# Patient Record
Sex: Female | Born: 1985 | Race: Black or African American | Hispanic: No | Marital: Single | Smoking: Never smoker
Health system: Southern US, Community
[De-identification: ages and names within clinical notes are randomized; demographics above are authoritative.]

## PROBLEM LIST (undated history)

## (undated) DIAGNOSIS — K5792 Diverticulitis of intestine, part unspecified, without perforation or abscess without bleeding: Secondary | ICD-10-CM

## (undated) DIAGNOSIS — F419 Anxiety disorder, unspecified: Secondary | ICD-10-CM

## (undated) DIAGNOSIS — D649 Anemia, unspecified: Secondary | ICD-10-CM

## (undated) DIAGNOSIS — IMO0002 Reserved for concepts with insufficient information to code with codable children: Secondary | ICD-10-CM

## (undated) DIAGNOSIS — I10 Essential (primary) hypertension: Secondary | ICD-10-CM

## (undated) DIAGNOSIS — T7840XA Allergy, unspecified, initial encounter: Secondary | ICD-10-CM

## (undated) DIAGNOSIS — F329 Major depressive disorder, single episode, unspecified: Secondary | ICD-10-CM

## (undated) DIAGNOSIS — F32A Depression, unspecified: Secondary | ICD-10-CM

## (undated) HISTORY — DX: Depression, unspecified: F32.A

## (undated) HISTORY — PX: TUBAL LIGATION: SHX77

## (undated) HISTORY — PX: NO PAST SURGERIES: SHX2092

## (undated) HISTORY — DX: Reserved for concepts with insufficient information to code with codable children: IMO0002

## (undated) HISTORY — DX: Major depressive disorder, single episode, unspecified: F32.9

## (undated) HISTORY — DX: Allergy, unspecified, initial encounter: T78.40XA

## (undated) HISTORY — DX: Anxiety disorder, unspecified: F41.9

## (undated) HISTORY — DX: Anemia, unspecified: D64.9

---

## 2010-08-25 DIAGNOSIS — R87619 Unspecified abnormal cytological findings in specimens from cervix uteri: Secondary | ICD-10-CM

## 2010-08-25 DIAGNOSIS — IMO0002 Reserved for concepts with insufficient information to code with codable children: Secondary | ICD-10-CM

## 2010-08-25 HISTORY — DX: Reserved for concepts with insufficient information to code with codable children: IMO0002

## 2010-08-25 HISTORY — DX: Unspecified abnormal cytological findings in specimens from cervix uteri: R87.619

## 2011-11-04 ENCOUNTER — Emergency Department (HOSPITAL_COMMUNITY)
Admission: EM | Admit: 2011-11-04 | Discharge: 2011-11-04 | Disposition: A | Discharge status: Home / Self Care | Payer: Self-pay | Attending: Emergency Medicine | Admitting: Emergency Medicine

## 2011-11-04 ENCOUNTER — Encounter (HOSPITAL_COMMUNITY): Payer: Self-pay

## 2011-11-04 DIAGNOSIS — IMO0001 Reserved for inherently not codable concepts without codable children: Secondary | ICD-10-CM | POA: Insufficient documentation

## 2011-11-04 DIAGNOSIS — R509 Fever, unspecified: Secondary | ICD-10-CM | POA: Insufficient documentation

## 2011-11-04 DIAGNOSIS — R059 Cough, unspecified: Secondary | ICD-10-CM | POA: Insufficient documentation

## 2011-11-04 DIAGNOSIS — R6883 Chills (without fever): Secondary | ICD-10-CM | POA: Insufficient documentation

## 2011-11-04 DIAGNOSIS — J3489 Other specified disorders of nose and nasal sinuses: Secondary | ICD-10-CM | POA: Insufficient documentation

## 2011-11-04 DIAGNOSIS — R599 Enlarged lymph nodes, unspecified: Secondary | ICD-10-CM | POA: Insufficient documentation

## 2011-11-04 DIAGNOSIS — R05 Cough: Secondary | ICD-10-CM | POA: Insufficient documentation

## 2011-11-04 DIAGNOSIS — J029 Acute pharyngitis, unspecified: Secondary | ICD-10-CM | POA: Insufficient documentation

## 2011-11-04 MED ORDER — IBUPROFEN 800 MG PO TABS: 800.0000 mg | ORAL_TABLET | Freq: Three times a day (TID) | ORAL | Status: AC

## 2011-11-04 MED ORDER — IBUPROFEN 800 MG PO TABS
800.0000 mg | ORAL_TABLET | Freq: Once | ORAL | Status: AC
  Administered 2011-11-04: 800 mg via ORAL
  Filled 2011-11-04: qty 1

## 2011-11-04 MED ORDER — AZITHROMYCIN 250 MG PO TABS: ORAL_TABLET | ORAL | Status: DC

## 2011-11-04 MED ORDER — HYDROCODONE-ACETAMINOPHEN 7.5-500 MG/15ML PO SOLN: ORAL | Status: DC

## 2011-11-04 NOTE — ED Provider Notes (Signed)
History     CSN: [id number]  Arrival date & time 11/04/11  1608   First MD Initiated Contact with Patient 11/04/11 1623      Chief Complaint  Patient presents with  . Sore Throat    (Consider location/radiation/quality/duration/timing/severity/associated sxs/prior treatment) Patient is a 26 y.o. female presenting with pharyngitis. The history is provided by the patient. No language interpreter was used.  Sore Throat This is a new problem. The current episode started 1 to 4 weeks ago. The problem occurs constantly. The problem has been waxing and waning. Associated symptoms include chills, congestion, coughing, a fever, myalgias, a sore throat and swollen glands. Pertinent negatives include no abdominal pain, arthralgias, headaches, neck pain, numbness, vomiting or weakness. The symptoms are aggravated by swallowing. Treatments tried: OTC cold medications. The treatment provided mild relief.    History reviewed. No pertinent past medical history.  History reviewed. No pertinent past surgical history.  No family history on file.  History  Substance Use Topics  . Smoking status: Never Smoker   . Smokeless tobacco: Not on file  . Alcohol Use: No    OB History    Grav Para Term Preterm Abortions TAB SAB Ect Mult Living                  Review of Systems  Constitutional: Positive for fever and chills. Negative for appetite change.  HENT: Positive for congestion and sore throat. Negative for facial swelling, drooling, mouth sores, trouble swallowing, neck pain and neck stiffness.   Respiratory: Positive for cough.   Gastrointestinal: Negative for vomiting and abdominal pain.  Musculoskeletal: Positive for myalgias. Negative for arthralgias.  Skin: Negative.   Neurological: Negative for dizziness, weakness, numbness and headaches.  Hematological: Positive for adenopathy.  All other systems reviewed and are negative.    Allergies  Penicillins  Home Medications    Current Outpatient Rx  Name Route Sig Dispense Refill  . PHENYLEPH-CPM-DM-APAP 12-24-08-250 MG PO TBEF Oral Take 2 tablets by mouth as needed. Dissolved in water, then drink as needed for cold and flu symptoms      BP 121/76  Pulse 114  Temp(Src) 99.1 F (37.3 C) (Oral)  Resp 20  Ht 5\' 2"  (1.575 m)  Wt 200 lb (90.719 kg)  BMI 36.58 kg/m2  SpO2 100%  LMP 10/09/2011  Physical Exam  Nursing note and vitals reviewed. Constitutional: She is oriented to person, place, and time. She appears well-developed and well-nourished. No distress.  HENT:  Head: Normocephalic and atraumatic. No trismus in the jaw.  Right Ear: Tympanic membrane and ear canal normal.  Left Ear: Tympanic membrane and ear canal normal.  Mouth/Throat: Uvula is midline and mucous membranes are normal. No uvula swelling. Oropharyngeal exudate, posterior oropharyngeal edema and posterior oropharyngeal erythema present. No tonsillar abscesses.  Neck: Trachea normal and normal range of motion. Neck supple. No Brudzinski's sign and no Kernig's sign noted. No thyromegaly present.  Cardiovascular: Normal rate, regular rhythm, normal heart sounds and intact distal pulses.   No murmur heard. Pulmonary/Chest: Effort normal and breath sounds normal. No respiratory distress. She exhibits no tenderness.  Abdominal: Soft. She exhibits no distension. There is no tenderness. There is no rebound and no guarding.       No hepatosplenomegaly  Musculoskeletal: Normal range of motion. She exhibits no tenderness.  Lymphadenopathy:    She has cervical adenopathy.       Right cervical: Superficial cervical adenopathy present.       Left cervical:  Superficial cervical adenopathy present.  Neurological: She is alert and oriented to person, place, and time. She exhibits normal muscle tone. Coordination normal.  Skin: Skin is warm and dry.    ED Course  Procedures (including critical care time)       MDM     Patient has history of  sore throat intermittently for 2 weeks she developed a fever and chills yesterday.  Airway is patent. Bilateral tonsils are enlarged, erythematous, and exudates are present. Mild anterior cervical lymphadenopathy. Probable strep pharyngitis have discussed treatment options with the patient and she prefers not to have strep test done at this time. I will begin antibiotics she agrees to return here in 2-3 days if her symptoms are not improving oral if they worsen.  5:37 PM patient is feeling better, vitals signs have improved, mucous membranes are moist and she is drinking fluids without difficulty. She is nontoxic appearing.   Patient / Family / Caregiver understand and agree with initial ED impression and plan with expectations set for ED visit. Pt stable in ED with no significant deterioration in condition. Pt feels improved after observation and/or treatment in ED.    Shaheim Mahar L. [place], [place] 11/08/11 1829

## 2011-11-04 NOTE — Discharge Instructions (Signed)

## 2011-11-04 NOTE — ED Notes (Signed)
Pt c/o sore throat x 2 weeks.  Reports intermittent fever/chills, and intermittent fever.

## 2011-11-10 NOTE — ED Provider Notes (Signed)
Medical screening examination/treatment/procedure(s) were performed by non-physician practitioner and as supervising physician I was immediately available for consultation/collaboration.   Joya Gaskins, MD 11/10/11 [phone]

## 2012-02-18 ENCOUNTER — Emergency Department (HOSPITAL_COMMUNITY)
Admission: EM | Admit: 2012-02-18 | Discharge: 2012-02-18 | Disposition: A | Discharge status: Home / Self Care | Payer: Self-pay | Attending: Emergency Medicine | Admitting: Emergency Medicine

## 2012-02-18 ENCOUNTER — Encounter (HOSPITAL_COMMUNITY): Payer: Self-pay | Admitting: *Deleted

## 2012-02-18 DIAGNOSIS — Z880 Allergy status to penicillin: Secondary | ICD-10-CM | POA: Insufficient documentation

## 2012-02-18 DIAGNOSIS — J029 Acute pharyngitis, unspecified: Secondary | ICD-10-CM | POA: Insufficient documentation

## 2012-02-18 MED ORDER — AZITHROMYCIN 250 MG PO TABS: 250.0000 mg | ORAL_TABLET | Freq: Every day | ORAL | Status: AC

## 2012-02-18 MED ORDER — ACETAMINOPHEN 325 MG PO TABS
650.0000 mg | ORAL_TABLET | Freq: Once | ORAL | Status: AC
  Administered 2012-02-18: 650 mg via ORAL
  Filled 2012-02-18: qty 2

## 2012-02-18 NOTE — ED Notes (Signed)
Sore throat, fever, body aches.  

## 2012-02-18 NOTE — Discharge Instructions (Signed)
Sore Throat A sore throat is felt inside the throat and at the back of the mouth. It hurts to swallow or the throat may feel dry and scratchy. It can be caused by germs, smoking, pollution, or allergies.  HOME CARE   Only take medicine as told by your doctor.   Drink enough fluids to keep your pee (urine) clear or pale yellow.   Eat soft foods.   Do not smoke.   Rinse the mouth (gargle) with warm water or salt water ( teaspoon salt in 8 ounces of water).   Try throat sprays, lozenges, or suck on hard candy.  GET HELP RIGHT AWAY IF:   You have trouble breathing.   Your sore throat lasts longer than 1 week.   There is more puffiness (swelling) in the throat.   The pain is so bad that you are unable to swallow.   You have a very bad headache or a red rash.   You start to throw up (vomit).   You or your child has a temperature by mouth above 102 F (38.9 C), not controlled by medicine.   Your baby is older than 3 months with a rectal temperature of 102 F (38.9 C) or higher.   Your baby is 77 months old or younger with a rectal temperature of 100.4 F (38 C) or higher.  MAKE SURE YOU:   Understand these instructions.   Will watch your condition.   Will get help right away if you are not doing well or get worse.  Document Released: 05/20/2008 Document Revised: 07/31/2011 Document Reviewed: 05/20/2008 [facility] Patient Information 2012 [place], [place].   Antibiotic for 5 days. Gargle.  Tylenol normal for fever. Increase fluids.

## 2012-02-18 NOTE — ED Provider Notes (Signed)
History   This chart was scribed for Megan Hutching, MD by Melba Coon. The patient was seen in room APA03/APA03 and the patient's care was started at 3:13PM.    CSN: [id number]  Arrival date & time 02/18/12  1428   First MD Initiated Contact with Patient 02/18/12 1508      Chief Complaint  Patient presents with  . Sore Throat    (Consider location/radiation/quality/duration/timing/severity/associated sxs/prior treatment) HPI Megan Jenkins is a 26 y.o. female who presents to the Emergency Department complaining of constant, moderate to severe sore throat with associated fever and generalized body aches an onset 2 days ago. 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 Decreased appetite present, but nml fluid intake to baseline. No HA, neck pain, rash, back pain, CP, SOB, abd pain, n/v/d, dysuria, or extremity pain, edema, weakness, numbness, or tingling. Allergic to penicillins. No other pertinent medical symptoms.  History reviewed. No pertinent past medical history.  History reviewed. No pertinent past surgical history.  History reviewed. No pertinent family history.  History  Substance Use Topics  . Smoking status: Never Smoker   . Smokeless tobacco: Not on file  . Alcohol Use: Yes    OB History    Grav Para Term Preterm Abortions TAB SAB Ect Mult Living                  Review of Systems 10 Systems reviewed and all are negative for acute change except as noted in the HPI.   Allergies  Penicillins  Home Medications   Current Outpatient Rx  Name Route Sig Dispense Refill  . AZITHROMYCIN 250 MG PO TABS  Take two tablets on day one, then one tab qd days 2-5 6 tablet 0  . HYDROCODONE-ACETAMINOPHEN 7.5-500 MG/15ML PO SOLN  10 ml po q 4-6 hrs prn pain 60 mL 0  . PHENYLEPH-CPM-DM-APAP 12-24-08-250 MG PO TBEF Oral Take 2 tablets by mouth as needed. Dissolved in water, then drink as needed for cold and flu symptoms      BP 136/82  Pulse 112  Temp 100.1  F (37.8 C) (Oral)  Resp 20  Ht 5\' 1"  (1.549 m)  Wt 205 lb (92.987 kg)  BMI 38.73 kg/m2  SpO2 99%  LMP 02/08/2012  Physical Exam  Nursing note and vitals reviewed. Constitutional: She is oriented to person, place, and time. She appears well-developed and well-nourished. No distress.  HENT:  Head: Normocephalic and atraumatic.  Right Ear: External ear normal.  Left Ear: External ear normal.       Left tonsillar edema with grey exudate.  Eyes: EOM are normal.  Neck: Normal range of motion. No tracheal deviation present.  Cardiovascular: Normal rate.   Pulmonary/Chest: Effort normal. No respiratory distress.  Abdominal: There is no tenderness.  Musculoskeletal: Normal range of motion. She exhibits no edema and no tenderness.  Neurological: She is alert and oriented to person, place, and time.  Skin: Skin is warm and dry.  Psychiatric: She has a normal mood and affect. Her behavior is normal.    ED Course  Procedures (including critical care time)  DIAGNOSTIC STUDIES: Oxygen Saturation is 99% on room air, normal by my interpretation.    COORDINATION OF CARE:  3:18PM - Pt advised work to stay home from work as she is still infectious; Zithromax will be prescribed to the pt. Pt ready for d/c.   Labs Reviewed - No data to display No results found.  No diagnosis found.    MDM  Physical exam shows exudative pharyngitis. Rx Zithromax. No clinical evidence of meningitis  I personally performed the services described in this documentation, which was scribed in my presence. The recorded information has been reviewed and considered.       Megan Hutching, MD 02/18/12 [phone]

## 2012-03-02 ENCOUNTER — Encounter (HOSPITAL_COMMUNITY): Payer: Self-pay | Admitting: *Deleted

## 2012-03-02 ENCOUNTER — Emergency Department (HOSPITAL_COMMUNITY)
Admission: EM | Admit: 2012-03-02 | Discharge: 2012-03-02 | Disposition: A | Discharge status: Home / Self Care | Payer: Self-pay | Attending: Emergency Medicine | Admitting: Emergency Medicine

## 2012-03-02 DIAGNOSIS — J029 Acute pharyngitis, unspecified: Secondary | ICD-10-CM | POA: Insufficient documentation

## 2012-03-02 MED ORDER — MELOXICAM 7.5 MG PO TABS: ORAL_TABLET | ORAL | Status: DC

## 2012-03-02 MED ORDER — IBUPROFEN 800 MG PO TABS
800.0000 mg | ORAL_TABLET | Freq: Once | ORAL | Status: AC
  Administered 2012-03-02: 800 mg via ORAL
  Filled 2012-03-02: qty 1

## 2012-03-02 MED ORDER — CEPHALEXIN 500 MG PO CAPS: 500.0000 mg | ORAL_CAPSULE | Freq: Four times a day (QID) | ORAL | Status: AC

## 2012-03-02 MED ORDER — ONDANSETRON HCL 4 MG PO TABS
4.0000 mg | ORAL_TABLET | Freq: Once | ORAL | Status: AC
  Administered 2012-03-02: 4 mg via ORAL
  Filled 2012-03-02: qty 1

## 2012-03-02 MED ORDER — PROMETHAZINE-CODEINE 6.25-10 MG/5ML PO SYRP: ORAL_SOLUTION | ORAL | Status: DC

## 2012-03-02 MED ORDER — HYDROCODONE-ACETAMINOPHEN 5-325 MG PO TABS
1.0000 | ORAL_TABLET | Freq: Once | ORAL | Status: AC
  Administered 2012-03-02: 1 via ORAL
  Filled 2012-03-02: qty 1

## 2012-03-02 MED ORDER — CEPHALEXIN 500 MG PO CAPS
500.0000 mg | ORAL_CAPSULE | Freq: Once | ORAL | Status: AC
  Administered 2012-03-02: 500 mg via ORAL
  Filled 2012-03-02: qty 1

## 2012-03-02 NOTE — ED Notes (Signed)
Discharge instructions reviewed with pt, questions answered. Pt verbalized understanding.  

## 2012-03-02 NOTE — ED Notes (Signed)
Pt c/o sore throat, was seen on 02/18/2012, given z-pack, still continues to have sore throat, pt states "the antibiotics were not long enough" denies any fever,

## 2012-03-02 NOTE — ED Notes (Signed)
Pt complaining of wait time, informed that she is the next patient to be seen. Link Snuffer PA made aware of patient complaint.

## 2012-03-07 NOTE — ED Provider Notes (Signed)
History     CSN: [id number]  Arrival date & time 03/02/12  1630   First MD Initiated Contact with Patient 03/02/12 1942      Chief Complaint  Patient presents with  . Sore Throat    (Consider location/radiation/quality/duration/timing/severity/associated sxs/prior treatment) Patient is a 26 y.o. female presenting with pharyngitis. The history is provided by the patient.  Sore Throat This is a recurrent problem. The current episode started 1 to 4 weeks ago. The problem occurs daily. The problem has been unchanged. Associated symptoms include headaches, myalgias and a sore throat. Pertinent negatives include no abdominal pain, arthralgias, chest pain, coughing, neck pain or rash. The symptoms are aggravated by swallowing. Treatments tried: oral antibiotics. The treatment provided no relief.    History reviewed. No pertinent past medical history.  History reviewed. No pertinent past surgical history.  No family history on file.  History  Substance Use Topics  . Smoking status: Never Smoker   . Smokeless tobacco: Not on file  . Alcohol Use: Yes    OB History    Grav Para Term Preterm Abortions TAB SAB Ect Mult Living                  Review of Systems  Constitutional: Negative for activity change.       All ROS Neg except as noted in HPI  HENT: Positive for sore throat. Negative for nosebleeds and neck pain.   Eyes: Negative for photophobia and discharge.  Respiratory: Negative for cough, shortness of breath and wheezing.   Cardiovascular: Negative for chest pain and palpitations.  Gastrointestinal: Negative for abdominal pain and blood in stool.  Genitourinary: Negative for dysuria, frequency and hematuria.  Musculoskeletal: Positive for myalgias. Negative for back pain and arthralgias.  Skin: Negative.  Negative for rash.  Neurological: Positive for headaches. Negative for dizziness, seizures and speech difficulty.  Psychiatric/Behavioral: Negative for hallucinations  and confusion.    Allergies  Penicillins  Home Medications   Current Outpatient Rx  Name Route Sig Dispense Refill  . AZITHROMYCIN 250 MG PO TABS  Take two tablets on day one, then one tab qd days 2-5 6 tablet 0  . CEPHALEXIN 500 MG PO CAPS Oral Take 1 capsule (500 mg total) by mouth 4 (four) times daily. 28 capsule 0  . HYDROCODONE-ACETAMINOPHEN 7.5-500 MG/15ML PO SOLN  10 ml po q 4-6 hrs prn pain 60 mL 0  . MELOXICAM 7.5 MG PO TABS  1 po bid with food 12 tablet 0  . PHENYLEPH-CPM-DM-APAP 12-24-08-250 MG PO TBEF Oral Take 2 tablets by mouth as needed. Dissolved in water, then drink as needed for cold and flu symptoms    . PROMETHAZINE-CODEINE 6.25-10 MG/5ML PO SYRP  5ml po q6h prn pain 100 mL 0    BP 122/89  Pulse 81  Temp 98.7 F (37.1 C) (Oral)  Resp 20  Ht 5\' 1"  (1.549 m)  Wt 205 lb (92.987 kg)  BMI 38.73 kg/m2  SpO2 100%  LMP 03/02/2012  Physical Exam  Nursing note and vitals reviewed. Constitutional: She is oriented to person, place, and time. She appears well-developed and well-nourished.  Non-toxic appearance.  HENT:  Head: Normocephalic.  Right Ear: Tympanic membrane and external ear normal.  Left Ear: Tympanic membrane and external ear normal.       Increase redness of the posterior pharynx.  Uvula midline. No abscess seen. Speech is clear.  Eyes: EOM and lids are normal. Pupils are equal, round, and reactive to light.  Neck: Normal range of motion. Neck supple. Carotid bruit is not present.  Cardiovascular: Normal rate, regular rhythm, normal heart sounds, intact distal pulses and normal pulses.   Pulmonary/Chest: Breath sounds normal. No respiratory distress.  Abdominal: Soft. Bowel sounds are normal. There is no tenderness. There is no guarding.  Musculoskeletal: Normal range of motion.  Lymphadenopathy:       Head (right side): No submandibular adenopathy present.       Head (left side): No submandibular adenopathy present.    She has no cervical adenopathy.    Neurological: She is alert and oriented to person, place, and time. She has normal strength. No cranial nerve deficit or sensory deficit. She exhibits normal muscle tone. Coordination normal.  Skin: Skin is warm and dry. No rash noted.  Psychiatric: She has a normal mood and affect. Her speech is normal.    ED Course  Procedures (including critical care time)  Labs Reviewed - No data to display No results found.   1. Sore throat       MDM  I have reviewed nursing notes, vital signs, and all appropriate lab and imaging results for this patient. Pt continues to have pain with swallowing and increase redness of the posterior pharynx. Discussed the possibility of a viral sore throat with patient. Pt insistent that she needs another antibiotic. Rx for keflex and mobic added to her current medications.        Kathie Dike, [place] 03/07/12 1659

## 2012-03-10 NOTE — ED Provider Notes (Signed)
Medical screening examination/treatment/procedure(s) were performed by non-physician practitioner and as supervising physician I was immediately available for consultation/collaboration.  Aasiya Creasey, MD 03/10/12 0125 

## 2012-11-17 ENCOUNTER — Ambulatory Visit (INDEPENDENT_AMBULATORY_CARE_PROVIDER_SITE_OTHER): Payer: PRIVATE HEALTH INSURANCE | Admitting: Obstetrics & Gynecology

## 2012-11-17 VITALS — BP 130/86 | Ht 62.0 in | Wt 189.0 lb

## 2012-11-17 DIAGNOSIS — Z3200 Encounter for pregnancy test, result unknown: Secondary | ICD-10-CM

## 2012-11-17 DIAGNOSIS — Z3480 Encounter for supervision of other normal pregnancy, unspecified trimester: Secondary | ICD-10-CM

## 2012-11-17 DIAGNOSIS — Z3201 Encounter for pregnancy test, result positive: Secondary | ICD-10-CM

## 2012-11-17 LAB — POCT URINE PREGNANCY: Preg Test, Ur: POSITIVE

## 2012-11-17 NOTE — Progress Notes (Signed)
Patient ID: Megan Jenkins, female   DOB: 03/26/1986, 27 y.o.   MRN: [id number] Positive pregnancy test

## 2012-11-22 ENCOUNTER — Other Ambulatory Visit: Payer: Self-pay | Admitting: Obstetrics & Gynecology

## 2012-11-22 DIAGNOSIS — O3680X0 Pregnancy with inconclusive fetal viability, not applicable or unspecified: Secondary | ICD-10-CM

## 2012-11-24 ENCOUNTER — Ambulatory Visit (INDEPENDENT_AMBULATORY_CARE_PROVIDER_SITE_OTHER): Payer: PRIVATE HEALTH INSURANCE

## 2012-11-24 ENCOUNTER — Other Ambulatory Visit: Payer: Self-pay | Admitting: Advanced Practice Midwife

## 2012-11-24 ENCOUNTER — Encounter: Payer: Self-pay | Admitting: Advanced Practice Midwife

## 2012-11-24 ENCOUNTER — Ambulatory Visit (INDEPENDENT_AMBULATORY_CARE_PROVIDER_SITE_OTHER): Payer: PRIVATE HEALTH INSURANCE | Admitting: Advanced Practice Midwife

## 2012-11-24 VITALS — BP 120/78 | Wt 193.0 lb

## 2012-11-24 DIAGNOSIS — Z3480 Encounter for supervision of other normal pregnancy, unspecified trimester: Secondary | ICD-10-CM

## 2012-11-24 DIAGNOSIS — O3680X0 Pregnancy with inconclusive fetal viability, not applicable or unspecified: Secondary | ICD-10-CM

## 2012-11-24 DIAGNOSIS — Z3481 Encounter for supervision of other normal pregnancy, first trimester: Secondary | ICD-10-CM

## 2012-11-24 DIAGNOSIS — Z1389 Encounter for screening for other disorder: Secondary | ICD-10-CM

## 2012-11-24 DIAGNOSIS — Z331 Pregnant state, incidental: Secondary | ICD-10-CM

## 2012-11-24 LAB — CBC
HCT: 37.5 % (ref 36.0–46.0)
Hemoglobin: 12.9 g/dL (ref 12.0–15.0)
MCH: 27.6 pg (ref 26.0–34.0)
MCHC: 34.4 g/dL (ref 30.0–36.0)
MCV: 80.3 fL (ref 78.0–100.0)

## 2012-11-24 LAB — POCT URINALYSIS DIPSTICK
Ketones, UA: NEGATIVE
Leukocytes, UA: NEGATIVE
Protein, UA: NEGATIVE

## 2012-11-24 LAB — US OB TRANSVAGINAL

## 2012-11-24 LAB — HIV ANTIBODY (ROUTINE TESTING W REFLEX): HIV: NONREACTIVE

## 2012-11-24 NOTE — Progress Notes (Signed)
  Subjective:    Megan Jenkins is a G2P1001 [redacted]w[redacted]d being seen today for her first obstetrical visit.  Her obstetrical history is significant for none. Patient does intend to breast feed. Pregnancy history fully reviewed.  Patient reports no complaints.  Filed Vitals:   11/24/12 1029  BP: 120/78  Weight: 193 lb (87.544 kg)    HISTORY: OB History   Grav Para Term Preterm Abortions TAB SAB Ect Mult Living   2 1 1       1      # Outc Date GA Lbr Len/2nd Wgt Sex Del Anes PTL Lv   1 TRM 2009 [redacted]w[redacted]d  6lb4oz(2.835kg) F SVD EPI  Yes   Comments: congenital hip dislocation--resolved   2 CUR             MEDICAL HISTORY:  Hx abnormal PAP 2012:  No treatment--"just watched it q 6 months"    Past Surgical History  Procedure Laterality Date  . No past surgeries     Family History  Problem Relation Age of Onset  . Hypertension Mother      Exam    Uterus: Deferred today (had pelvic US--will do next visti  Pelvic Exam:                               System: Breast:  normal appearance, no masses or tenderness   Skin: normal coloration and turgor, no rashes    Neurologic: oriented, normal, normal mood   Extremities: normal strength, tone, and muscle mass   HEENT PERRLA   Mouth/Teeth mucous membranes moist, pharynx normal without lesions   Neck supple and no masses   Cardiovascular: regular rate and rhythm   Respiratory:  appears well, vitals normal, no respiratory distress, acyanotic, normal RR   Abdomen: soft, non-tender; bowel sounds normal; no masses,  no organomegaly          Assessment:    Pregnancy: G2P1001 Patient Active Problem List  Diagnosis  . Supervision of other normal pregnancy        Plan:     Initial labs drawn. Prenatal vitamins. Problem list reviewed and updated. Genetic Screening discussed Integrated Screen: requested.  Ultrasound discussed; fetal survey: requested.  Follow up in 2 weeks for U/S to check viability,  PAP  CRESENZO-DISHMAN,Aricka Goldberger 11/24/2012

## 2012-11-24 NOTE — Progress Notes (Signed)
Consents signed. New OB packet given.

## 2012-11-24 NOTE — Progress Notes (Signed)
U/S (5+5wks)-transvaginal u/s performed, single intrauterine gestational sac noted with ?YS noted within, no fetal pole noted, GS meas c/w LMP(5+2wks) dates, cx long and closed, bilateral adnexa wnl would like to reck for viability

## 2012-11-24 NOTE — Assessment & Plan Note (Signed)
Clinic:[facility]  Genetic Screen NT:                            First Screen:               Quad Screen/MSAFP:  Anatomic US   Glucose Screen   GBS   Feeding Preference   Contraception   Circumcision       

## 2012-11-25 LAB — VARICELLA ZOSTER ANTIBODY, IGG: Varicella IgG: 2827 Index — ABNORMAL HIGH (ref <135.00)

## 2012-11-25 LAB — DRUG SCREEN, URINE, NO CONFIRMATION
Amphetamine Screen, Ur: NEGATIVE
Barbiturate Quant, Ur: NEGATIVE
Benzodiazepines.: NEGATIVE
Marijuana Metabolite: NEGATIVE
Methadone: NEGATIVE
Opiate Screen, Urine: NEGATIVE

## 2012-11-25 LAB — URINALYSIS
Glucose, UA: NEGATIVE mg/dL
Hgb urine dipstick: NEGATIVE
pH: 6 (ref 5.0–8.0)

## 2012-11-25 LAB — ABO AND RH: Rh Type: POSITIVE

## 2012-11-26 LAB — CYSTIC FIBROSIS DIAGNOSTIC STUDY

## 2012-11-26 LAB — GC/CHLAMYDIA PROBE AMP: GC Probe RNA: NEGATIVE

## 2012-11-30 ENCOUNTER — Encounter: Payer: Self-pay | Admitting: Advanced Practice Midwife

## 2012-11-30 ENCOUNTER — Telehealth: Payer: Self-pay | Admitting: Obstetrics & Gynecology

## 2012-11-30 MED ORDER — AZITHROMYCIN 250 MG PO TABS: 1000.0000 mg | ORAL_TABLET | Freq: Once | ORAL | Status: DC

## 2012-11-30 NOTE — Telephone Encounter (Signed)
Zithromycin 1 gram e prescribed for positive chlamydia

## 2012-12-02 ENCOUNTER — Telehealth: Payer: Self-pay | Admitting: Obstetrics & Gynecology

## 2012-12-03 ENCOUNTER — Other Ambulatory Visit: Payer: Self-pay | Admitting: Advanced Practice Midwife

## 2012-12-03 ENCOUNTER — Ambulatory Visit (INDEPENDENT_AMBULATORY_CARE_PROVIDER_SITE_OTHER): Payer: PRIVATE HEALTH INSURANCE | Admitting: Obstetrics & Gynecology

## 2012-12-03 ENCOUNTER — Encounter: Payer: Self-pay | Admitting: Obstetrics & Gynecology

## 2012-12-03 ENCOUNTER — Ambulatory Visit (INDEPENDENT_AMBULATORY_CARE_PROVIDER_SITE_OTHER): Payer: PRIVATE HEALTH INSURANCE

## 2012-12-03 VITALS — BP 124/84 | Wt 186.0 lb

## 2012-12-03 DIAGNOSIS — O021 Missed abortion: Secondary | ICD-10-CM

## 2012-12-03 DIAGNOSIS — Z3481 Encounter for supervision of other normal pregnancy, first trimester: Secondary | ICD-10-CM

## 2012-12-03 NOTE — Patient Instructions (Signed)
Blighted Ovum A blighted ovum (anembryonic pregnancy) happens when a fertilized egg (embryo) attaches itself to the uterine wall, but the embryo does not develop. The pregnancy sac (placenta) continues to grow even though the embryo does not grow and develop.The pregnancy hormone is still secreted because the placenta has formed. This will result in a positive pregnancy test despite having an abnormal pregnancy. A blighted ovum occurs within the first trimester, sometimes before a woman knows she is pregnant.  CAUSES A blighted ovum is usually the result of chromosomal problems. This can be caused by abnormal cell division or poor quality sperm or egg. SYMPTOMS Early on, signs of pregnancy may be experienced, such as:  A missed menstrual period.  Fatigue.  Feeling sick to your stomach (nauseous).  Sore breasts.  A positive pregnancy test. Then, signs of miscarriage may develop, such as:  Abdominal cramps.  Vaginal bleeding or spotting.  A menstrual period that is heavier than usual. DIAGNOSIS The diagnosis of a blighted ovum is made with an ultrasound test that shows an empty uterus or an empty gestational sac. TREATMENT Your caregiver will help you decide what the best treatment is for you. Treatment for a blighted ovum includes:   Letting your body naturally pass the tissue of a blighted ovum.  Taking medicine to trigger the miscarriage.  Having a procedure called a dilation and curettage (D&C) to remove the placental tissues.  A D&C may be helpful if you would like the tissue examined to determine the reason for a miscarriage. Talk to your caregiver about the risks involved with this procedure. HOME CARE INSTRUCTIONS   Follow up with your caregiver to make sure that your pregnancy hormone returns to zero.  Wait at least 1 to 3 regular menstrual cycles before trying to get pregnant again, or as recommended by your caregiver. SEEK IMMEDIATE MEDICAL CARE IF:  You have  worsening abdominal pain.  You have very heavy bleeding or use 1 to 2 pads every hour, for more than 2 hours.  You are dizzy, feel faint, or pass out. Document Released: 11/26/2010 Document Revised: 11/03/2011 Document Reviewed: 11/26/2010 [facility] Patient Information 2013 [place], [place].

## 2012-12-03 NOTE — Telephone Encounter (Signed)
Front staff to fax STD results to [place] at [facility] Department

## 2012-12-03 NOTE — Progress Notes (Signed)
Unable to void. Small amount vaginal bleeding yesterday.

## 2012-12-03 NOTE — Progress Notes (Signed)
U/S-transvaginal u/s performed, single intrauterine gest sac noted, GS meas c/w 6+2wks, no fetal pole or yolk sac noted within GS, cx long and closed, bilateral adnexa wnl, no free fluid or adnexal masses noted

## 2012-12-07 LAB — US OB TRANSVAGINAL

## 2012-12-08 ENCOUNTER — Ambulatory Visit: Payer: PRIVATE HEALTH INSURANCE | Admitting: Advanced Practice Midwife

## 2012-12-08 ENCOUNTER — Encounter: Payer: Self-pay | Admitting: *Deleted

## 2012-12-08 NOTE — Progress Notes (Signed)
Probable missed AB 4/11.  Instructed to f/u if did not have a period in 2 weeks by [facility].  A user error has taken place: Opened in error.

## 2013-01-04 ENCOUNTER — Telehealth: Payer: Self-pay | Admitting: Family Medicine

## 2013-01-04 NOTE — Telephone Encounter (Signed)
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

## 2013-01-06 ENCOUNTER — Telehealth: Payer: Self-pay | Admitting: Physician Assistant

## 2013-01-06 ENCOUNTER — Telehealth: Payer: Self-pay | Admitting: *Deleted

## 2013-01-06 NOTE — Telephone Encounter (Signed)
Please advise 

## 2013-01-06 NOTE — Telephone Encounter (Signed)
WC pt- informed pt she could do a sitting type job that should not hurt the knee Referred to [facility] nurse here

## 2013-01-06 NOTE — Telephone Encounter (Signed)
Pt needs a note for 4/11 and 4/12 for work. Ok per [place] to give note. Note put in front office for pt to pick up. Pt is aware.

## 2013-02-14 ENCOUNTER — Telehealth: Payer: Self-pay | Admitting: Family Medicine

## 2013-02-14 NOTE — Telephone Encounter (Signed)
This is not a EPIC matter- noted in pt's w/c chart

## 2013-03-02 ENCOUNTER — Ambulatory Visit (INDEPENDENT_AMBULATORY_CARE_PROVIDER_SITE_OTHER): Payer: PRIVATE HEALTH INSURANCE | Admitting: Adult Health

## 2013-03-02 ENCOUNTER — Encounter: Payer: Self-pay | Admitting: Adult Health

## 2013-03-02 VITALS — BP 120/84 | Ht 61.0 in | Wt 197.0 lb

## 2013-03-02 DIAGNOSIS — Z3202 Encounter for pregnancy test, result negative: Secondary | ICD-10-CM

## 2013-03-02 DIAGNOSIS — Z3201 Encounter for pregnancy test, result positive: Secondary | ICD-10-CM

## 2013-03-02 LAB — POCT URINE PREGNANCY: Preg Test, Ur: POSITIVE

## 2013-03-03 ENCOUNTER — Telehealth: Payer: Self-pay | Admitting: *Deleted

## 2013-03-03 LAB — HCG, QUANTITATIVE, PREGNANCY: hCG, Beta Chain, Quant, S: 208.1 m[IU]/mL

## 2013-03-03 NOTE — Telephone Encounter (Signed)
Pt called regarding QHCG, which is 208.1, she is not having any problems and has app 7/17 for [place], she is aware that the value correlates to about [redacted] weeks pregnant.She has a 25 year old at home.

## 2013-03-04 ENCOUNTER — Other Ambulatory Visit: Payer: Self-pay | Admitting: Obstetrics & Gynecology

## 2013-03-04 DIAGNOSIS — O3680X0 Pregnancy with inconclusive fetal viability, not applicable or unspecified: Secondary | ICD-10-CM

## 2013-03-10 ENCOUNTER — Ambulatory Visit (INDEPENDENT_AMBULATORY_CARE_PROVIDER_SITE_OTHER): Payer: PRIVATE HEALTH INSURANCE

## 2013-03-10 DIAGNOSIS — O3680X0 Pregnancy with inconclusive fetal viability, not applicable or unspecified: Secondary | ICD-10-CM

## 2013-03-17 ENCOUNTER — Other Ambulatory Visit: Payer: Self-pay | Admitting: Obstetrics & Gynecology

## 2013-03-17 DIAGNOSIS — O3680X0 Pregnancy with inconclusive fetal viability, not applicable or unspecified: Secondary | ICD-10-CM

## 2013-03-23 ENCOUNTER — Encounter: Payer: Self-pay | Admitting: Women's Health

## 2013-03-23 ENCOUNTER — Other Ambulatory Visit: Payer: Self-pay | Admitting: Obstetrics & Gynecology

## 2013-03-23 ENCOUNTER — Ambulatory Visit (INDEPENDENT_AMBULATORY_CARE_PROVIDER_SITE_OTHER): Payer: PRIVATE HEALTH INSURANCE

## 2013-03-23 ENCOUNTER — Ambulatory Visit (INDEPENDENT_AMBULATORY_CARE_PROVIDER_SITE_OTHER): Payer: PRIVATE HEALTH INSURANCE | Admitting: Women's Health

## 2013-03-23 ENCOUNTER — Ambulatory Visit: Payer: PRIVATE HEALTH INSURANCE | Admitting: Women's Health

## 2013-03-23 VITALS — BP 140/84 | Wt 199.2 lb

## 2013-03-23 DIAGNOSIS — Z331 Pregnant state, incidental: Secondary | ICD-10-CM

## 2013-03-23 DIAGNOSIS — O039 Complete or unspecified spontaneous abortion without complication: Secondary | ICD-10-CM

## 2013-03-23 DIAGNOSIS — O09299 Supervision of pregnancy with other poor reproductive or obstetric history, unspecified trimester: Secondary | ICD-10-CM

## 2013-03-23 DIAGNOSIS — A7489 Other chlamydial diseases: Secondary | ICD-10-CM

## 2013-03-23 DIAGNOSIS — O3680X0 Pregnancy with inconclusive fetal viability, not applicable or unspecified: Secondary | ICD-10-CM

## 2013-03-23 DIAGNOSIS — Z1389 Encounter for screening for other disorder: Secondary | ICD-10-CM

## 2013-03-23 LAB — POCT URINALYSIS DIPSTICK
Blood, UA: 1
Nitrite, UA: NEGATIVE
Protein, UA: NEGATIVE

## 2013-03-23 MED ORDER — HYDROCODONE-ACETAMINOPHEN 5-325 MG PO TABS: 1.0000 | ORAL_TABLET | ORAL | Status: DC | PRN

## 2013-03-23 MED ORDER — MISOPROSTOL 200 MCG PO TABS: 800.0000 ug | ORAL_TABLET | Freq: Once | ORAL | Status: DC

## 2013-03-23 NOTE — Progress Notes (Signed)
Megan Jenkins is a 27 y.o. G39P1021 African-American female who is here today for f/u u/s and new OB appt. She had a 1st trimester SAB in April 2014.  On 03/02/13 she had a bHCG or 208.1, on 03/11/13 she had an u/s showing GS @ 6.5wks only. Dimple denies any cramping/bleeding/abdominal pain. States she isn't actively trying to conceive, but is not doing anything to prevent either, and would like to initiate contraception.   O: BP 140/84, Wt: 199.2lbs      On today's f/u u/s there is a GS @ 6.6wks only, adnexae normal w/o free fluid  A: Blighted ovum      P: Discussed w/ JVF, to proceed w/ medicinal management     Cytotec po x 1, repeat in 48hrs if needed     Lortab 5/325mg  1 po q 4hr prn pain #16 0RF      F/U 1 wk for bHCG, and visit to discuss contraception     Letter to be out of work through [employer], CNM, [facility] 03/23/2013 11:30 AM

## 2013-03-23 NOTE — Patient Instructions (Signed)
Take all 4 tablets of cytotec ( total) at one time. This will cause a lot of cramping, you may have bleeding, and pass tissue, then the cramping and bleeding should get better. If you do not pass the tissue, then you can take 4 more tablets of cytotec ( total) 48 hours after your first dose.  You will come back to have your blood drawn to make sure the pregnancy hormones are dropping in 1 week. Please call us if you have any questions.   Miscarriage A miscarriage is the sudden loss of an unborn baby (fetus) before the 20th week of pregnancy. Most miscarriages happen in the first 3 months of pregnancy. Sometimes, it happens before a woman even knows she is pregnant. A miscarriage is also called a "spontaneous miscarriage" or "early pregnancy loss." Having a miscarriage can be an emotional experience. Talk with your caregiver about any questions you may have about miscarrying, the grieving process, and your future pregnancy plans. CAUSES   Problems with the fetal chromosomes that make it impossible for the baby to develop normally. Problems with the baby's genes or chromosomes are most often the result of errors that occur, by chance, as the embryo divides and grows. The problems are not inherited from the parents.  Infection of the cervix or uterus.   Hormone problems.   Problems with the cervix, such as having an incompetent cervix. This is when the tissue in the cervix is not strong enough to hold the pregnancy.   Problems with the uterus, such as an abnormally shaped uterus, uterine fibroids, or congenital abnormalities.   Certain medical conditions.   Smoking, drinking alcohol, or taking illegal drugs.   Trauma.  Often, the cause of a miscarriage is unknown.  SYMPTOMS   Vaginal bleeding or spotting, with or without cramps or pain.  Pain or cramping in the abdomen or lower back.  Passing fluid, tissue, or blood clots from the vagina. DIAGNOSIS  Your caregiver will  perform a physical exam. You may also have an ultrasound to confirm the miscarriage. Blood or urine tests may also be ordered. TREATMENT   Sometimes, treatment is not necessary if you naturally pass all the fetal tissue that was in the uterus. If some of the fetus or placenta remains in the body (incomplete miscarriage), tissue left behind may become infected and must be removed. Usually, a dilation and curettage (D and C) procedure is performed. During a D and C procedure, the cervix is widened (dilated) and any remaining fetal or placental tissue is gently removed from the uterus.  Antibiotic medicines are prescribed if there is an infection. Other medicines may be given to reduce the size of the uterus (contract) if there is a lot of bleeding.  If you have Rh negative blood and your baby was Rh positive, you will need a Rh immunoglobulin shot. This shot will protect any future baby from having Rh blood problems in future pregnancies. HOME CARE INSTRUCTIONS   Your caregiver may order bed rest or may allow you to continue light activity. Resume activity as directed by your caregiver.  Have someone help with home and family responsibilities during this time.   Keep track of the number of sanitary pads you use each day and how soaked (saturated) they are. Write down this information.   Do not use tampons. Do not douche or have sexual intercourse until approved by your caregiver.   Only take over-the-counter or prescription medicines for pain or discomfort as directed by  your caregiver.   Do not take aspirin. Aspirin can cause bleeding.   Keep all follow-up appointments with your caregiver.   If you or your partner have problems with grieving, talk to your caregiver or seek counseling to help cope with the pregnancy loss. Allow enough time to grieve before trying to get pregnant again.  SEEK IMMEDIATE MEDICAL CARE IF:   You have severe cramps or pain in your back or abdomen.  You  have a fever.  You pass large blood clots (walnut-sized or larger) ortissue from your vagina. Save any tissue for your caregiver to inspect.   Your bleeding increases.   You have a thick, bad-smelling vaginal discharge.  You become lightheaded, weak, or you faint.   You have chills.  MAKE SURE YOU:  Understand these instructions.  Will watch your condition.  Will get help right away if you are not doing well or get worse. Document Released: 02/04/2001 Document Revised: 02/10/2012 Document Reviewed: 09/30/2011 [facility] Patient Information 2014 [place], [place].

## 2013-03-23 NOTE — Progress Notes (Signed)
Please refer to other note from me on 03/23/13.  Cheral Marker, CNM, [facility] 03/23/2013

## 2013-03-24 ENCOUNTER — Encounter: Payer: Self-pay | Admitting: Women's Health

## 2013-03-24 DIAGNOSIS — A749 Chlamydial infection, unspecified: Secondary | ICD-10-CM | POA: Insufficient documentation

## 2013-03-24 LAB — GC/CHLAMYDIA PROBE AMP
CT Probe RNA: NEGATIVE
GC Probe RNA: NEGATIVE

## 2013-03-31 ENCOUNTER — Ambulatory Visit: Payer: PRIVATE HEALTH INSURANCE | Admitting: Adult Health

## 2013-04-06 ENCOUNTER — Ambulatory Visit: Payer: PRIVATE HEALTH INSURANCE | Admitting: Adult Health

## 2013-04-06 ENCOUNTER — Encounter: Payer: Self-pay | Admitting: *Deleted

## 2013-05-02 ENCOUNTER — Emergency Department (HOSPITAL_COMMUNITY): Payer: Medicaid Other

## 2013-05-02 ENCOUNTER — Encounter (HOSPITAL_COMMUNITY): Payer: Self-pay | Admitting: Emergency Medicine

## 2013-05-02 ENCOUNTER — Emergency Department (HOSPITAL_COMMUNITY)
Admission: EM | Admit: 2013-05-02 | Discharge: 2013-05-02 | Disposition: A | Discharge status: Home / Self Care | Payer: Medicaid Other | Attending: Emergency Medicine | Admitting: Emergency Medicine

## 2013-05-02 DIAGNOSIS — Y92410 Unspecified street and highway as the place of occurrence of the external cause: Secondary | ICD-10-CM | POA: Insufficient documentation

## 2013-05-02 DIAGNOSIS — Z880 Allergy status to penicillin: Secondary | ICD-10-CM | POA: Insufficient documentation

## 2013-05-02 DIAGNOSIS — S20211A Contusion of right front wall of thorax, initial encounter: Secondary | ICD-10-CM

## 2013-05-02 DIAGNOSIS — S20219A Contusion of unspecified front wall of thorax, initial encounter: Secondary | ICD-10-CM | POA: Insufficient documentation

## 2013-05-02 DIAGNOSIS — Y9389 Activity, other specified: Secondary | ICD-10-CM | POA: Insufficient documentation

## 2013-05-02 MED ORDER — HYDROCODONE-ACETAMINOPHEN 5-325 MG PO TABS
1.0000 | ORAL_TABLET | Freq: Once | ORAL | Status: AC
  Administered 2013-05-02: 1 via ORAL
  Filled 2013-05-02: qty 1

## 2013-05-02 MED ORDER — METHOCARBAMOL 500 MG PO TABS: 500.0000 mg | ORAL_TABLET | Freq: Two times a day (BID) | ORAL | Status: DC

## 2013-05-02 MED ORDER — IBUPROFEN 400 MG PO TABS
600.0000 mg | ORAL_TABLET | Freq: Once | ORAL | Status: AC
  Administered 2013-05-02: 600 mg via ORAL
  Filled 2013-05-02 (×2): qty 1

## 2013-05-02 MED ORDER — IBUPROFEN 600 MG PO TABS: 600.0000 mg | ORAL_TABLET | Freq: Four times a day (QID) | ORAL | Status: DC | PRN

## 2013-05-02 MED ORDER — METHOCARBAMOL 500 MG PO TABS
500.0000 mg | ORAL_TABLET | Freq: Once | ORAL | Status: AC
  Administered 2013-05-02: 500 mg via ORAL
  Filled 2013-05-02: qty 1

## 2013-05-02 MED ORDER — HYDROCODONE-ACETAMINOPHEN 5-325 MG PO TABS: 1.0000 | ORAL_TABLET | ORAL | Status: DC | PRN

## 2013-05-02 NOTE — ED Notes (Signed)
Pt is very anxious to leave. Pt informed we are waiting on her x-ray to result.
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
## 2013-05-02 NOTE — ED Notes (Signed)
Radiology called to inquire about chest xray. 20 minutes.
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
## 2013-07-14 ENCOUNTER — Other Ambulatory Visit: Payer: PRIVATE HEALTH INSURANCE | Admitting: Advanced Practice Midwife

## 2013-08-03 ENCOUNTER — Other Ambulatory Visit: Payer: PRIVATE HEALTH INSURANCE | Admitting: Advanced Practice Midwife

## 2013-08-15 ENCOUNTER — Encounter (HOSPITAL_COMMUNITY): Payer: Self-pay | Admitting: Emergency Medicine

## 2013-08-15 ENCOUNTER — Emergency Department (HOSPITAL_COMMUNITY)
Admission: EM | Admit: 2013-08-15 | Discharge: 2013-08-15 | Disposition: A | Discharge status: Home / Self Care | Payer: Medicaid Other | Attending: Emergency Medicine | Admitting: Emergency Medicine

## 2013-08-15 DIAGNOSIS — Z880 Allergy status to penicillin: Secondary | ICD-10-CM | POA: Insufficient documentation

## 2013-08-15 DIAGNOSIS — J029 Acute pharyngitis, unspecified: Secondary | ICD-10-CM | POA: Insufficient documentation

## 2013-08-15 DIAGNOSIS — Z79899 Other long term (current) drug therapy: Secondary | ICD-10-CM | POA: Insufficient documentation

## 2013-08-15 NOTE — ED Notes (Signed)
Patient c/o sore throat x 2 days. 

## 2013-08-15 NOTE — ED Notes (Signed)
Patient wanted to speak to MD, MD at bedside.  Patient left w/o signing d/c

## 2013-08-15 NOTE — ED Provider Notes (Addendum)
CSN: [id number]     Arrival date & time 08/15/13  0022 History   First MD Initiated Contact with Patient 08/15/13 0057     Chief Complaint  Patient presents with  . Sore Throat   (Consider location/radiation/quality/duration/timing/severity/associated sxs/prior Treatment) Patient is a 27 y.o. female presenting with pharyngitis. The history is provided by the patient.  Sore Throat This is a new problem. The current episode started 2 days ago. The problem occurs constantly. The problem has been gradually worsening. Pertinent negatives include no chest pain, no abdominal pain and no shortness of breath. The symptoms are aggravated by swallowing. Nothing relieves the symptoms. She has tried nothing for the symptoms. The treatment provided no relief.    Past Medical History  Diagnosis Date  . Abnormal Pap smear 2012    No treatment, "Just watching it q 6 months"   Past Surgical History  Procedure Laterality Date  . No past surgeries     Family History  Problem Relation Age of Onset  . Hypertension Mother    History  Substance Use Topics  . Smoking status: Never Smoker   . Smokeless tobacco: Not on file  . Alcohol Use: Yes     Comment: occ; not now   OB History   Grav Para Term Preterm Abortions TAB SAB Ect Mult Living   3 1 1  2  2   1      Review of Systems  Respiratory: Negative for shortness of breath.   Cardiovascular: Negative for chest pain.  Gastrointestinal: Negative for abdominal pain.  All other systems reviewed and are negative.    Allergies  Penicillins  Home Medications   Current Outpatient Rx  Name  Route  Sig  Dispense  Refill  . HYDROcodone-acetaminophen (NORCO) 5-325 MG per tablet   Oral   Take 1 tablet by mouth every 4 (four) hours as needed for pain.   10 tablet   0   . ibuprofen (ADVIL,MOTRIN) 600 MG tablet   Oral   Take 1 tablet (600 mg total) by mouth every 6 (six) hours as needed for pain.   30 tablet   0   . methocarbamol (ROBAXIN)  500 MG tablet   Oral   Take 1 tablet (500 mg total) by mouth 2 (two) times daily.   20 tablet   0    BP 142/83  Pulse 93  Temp(Src) 99.1 F (37.3 C) (Oral)  Resp 18  Ht 5\' 1"  (1.549 m)  Wt 200 lb (90.719 kg)  BMI 37.81 kg/m2  SpO2 98%  LMP 08/14/2013 Physical Exam  Nursing note and vitals reviewed. Constitutional: She is oriented to person, place, and time. She appears well-developed and well-nourished. No distress.  HENT:  Head: Normocephalic and atraumatic.  The posterior oropharynx is erythematous without exudates.  Neck: Normal range of motion. Neck supple.  Cardiovascular: Normal rate and regular rhythm.  Exam reveals no gallop and no friction rub.   No murmur heard. Pulmonary/Chest: Effort normal and breath sounds normal. No respiratory distress. She has no wheezes.  Abdominal: Soft. Bowel sounds are normal. She exhibits no distension. There is no tenderness.  Musculoskeletal: Normal range of motion.  Neurological: She is alert and oriented to person, place, and time.  Skin: Skin is warm and dry. She is not diaphoretic.    ED Course  Procedures (including critical care time) Labs Review Labs Reviewed  RAPID STREP SCREEN   Imaging Review No results found.    MDM  No diagnosis  found. Strep test negative. Symptoms likely viral in nature. We'll recommend ibuprofen, plenty of fluids and when necessary followup if not improving in the next 2-3 days.    Geoffery Lyons, MD 08/15/13 0145   When I returned the patient's room to discuss the results of the strep test, she became angry and confrontational due to my unwillingness to prescribe her an antibiotic. She informed me that she always gets antibiotics when she has a sore throat because in the past her viral infections have "turned into strep". I tried to educate her to the fact that viruses don't become bacteria, however she did not have much interest in listening to my explanation. She made the little he remarks  about me, stormed past me, then left the emergency Department angry stating that she was going to see a doctor that knew what he was doing.  In summary I am uncomfortable prescribing antibiotics for viral infections, and I do not feel as though writing an antibiotic just to appease the patient is appropriate. I informed her that it was possible the rapid strep could be a false negative. A definitive culture for strep will be performed and should result within 24 hours. If this turns positive she will be notified. Again she had very little interest in listening to my explanation.   Geoffery Lyons, MD 08/15/13 [phone]

## 2013-08-17 LAB — CULTURE, GROUP A STREP

## 2013-11-16 ENCOUNTER — Encounter (INDEPENDENT_AMBULATORY_CARE_PROVIDER_SITE_OTHER): Payer: Self-pay

## 2014-06-26 ENCOUNTER — Encounter (HOSPITAL_COMMUNITY): Payer: Self-pay | Admitting: Emergency Medicine

## 2015-12-18 ENCOUNTER — Ambulatory Visit: Payer: Self-pay | Admitting: Family Medicine

## 2015-12-19 ENCOUNTER — Encounter: Payer: Self-pay | Admitting: Family Medicine

## 2015-12-19 ENCOUNTER — Encounter: Payer: Self-pay | Admitting: *Deleted

## 2015-12-19 ENCOUNTER — Encounter (INDEPENDENT_AMBULATORY_CARE_PROVIDER_SITE_OTHER): Payer: Self-pay

## 2015-12-19 ENCOUNTER — Ambulatory Visit (INDEPENDENT_AMBULATORY_CARE_PROVIDER_SITE_OTHER): Payer: BLUE CROSS/BLUE SHIELD | Admitting: Family Medicine

## 2015-12-19 VITALS — BP 134/79 | HR 100 | Temp 98.4°F | Ht 61.0 in | Wt 214.0 lb

## 2015-12-19 DIAGNOSIS — F32A Depression, unspecified: Secondary | ICD-10-CM

## 2015-12-19 DIAGNOSIS — F329 Major depressive disorder, single episode, unspecified: Secondary | ICD-10-CM

## 2015-12-19 MED ORDER — DULOXETINE HCL 30 MG PO CPEP: 30.0000 mg | ORAL_CAPSULE | Freq: Every day | ORAL | Status: DC

## 2015-12-19 NOTE — Progress Notes (Signed)
Subjective:  Patient ID: Megan Jenkins, female    DOB: June 22, 1986  Age: 30 y.o. MRN: [id number]  CC: Establish Care   HPI Megan Jenkins presents for depression brought on by circumstances. She has a  daughter with ADHD, adjustment disorder. Resistant to school. Age 31. "I can't catch a break with her. " Not sleeping. Not wanting to do anything. Nothing is fun. Sx worsening over several weeks. No prior treatment.  Depression screen PHQ 2/9 12/19/2015  Decreased Interest 2  Down, Depressed, Hopeless 3  PHQ - 2 Score 5  Altered sleeping 3  Tired, decreased energy 2  Change in appetite 3  Feeling bad or failure about yourself  2  Trouble concentrating 2  Moving slowly or fidgety/restless 0  Suicidal thoughts 0  PHQ-9 Score 17  Difficult doing work/chores Very difficult      History Megan Jenkins has a past medical history of Abnormal Pap smear (2012).   She has past surgical history that includes No past surgeries and Tubal ligation.   Her family history includes Hypertension in her mother.She reports that she has never smoked. She does not have any smokeless tobacco history on file. She reports that she drinks alcohol. She reports that she does not use illicit drugs.  No current outpatient prescriptions on file prior to visit.   No current facility-administered medications on file prior to visit.    ROS Review of Systems  Constitutional: Negative for fever, activity change and appetite change.  HENT: Negative for congestion, rhinorrhea and sore throat.   Eyes: Negative for visual disturbance.  Respiratory: Negative for cough and shortness of breath.   Cardiovascular: Negative for chest pain and palpitations.  Gastrointestinal: Negative for nausea, abdominal pain and diarrhea.  Genitourinary: Negative for dysuria.  Musculoskeletal: Negative for myalgias and arthralgias.    Objective:  BP 134/79 mmHg  Pulse 100  Temp(Src) 98.4 F (36.9 C) (Oral)  Ht 5' 1"  (1.549 m)  Wt 214 lb  (97.07 kg)  BMI 40.46 kg/m2  SpO2 98%  LMP 11/30/2015 (Approximate)  Physical Exam  Constitutional: She is oriented to person, place, and time. She appears well-developed and well-nourished. No distress.  HENT:  Head: Normocephalic and atraumatic.  Right Ear: External ear normal.  Left Ear: External ear normal.  Nose: Nose normal.  Mouth/Throat: Oropharynx is clear and moist.  Eyes: Conjunctivae and EOM are normal. Pupils are equal, round, and reactive to light.  Neck: Normal range of motion. Neck supple. No thyromegaly present.  Cardiovascular: Normal rate, regular rhythm and normal heart sounds.   No murmur heard. Pulmonary/Chest: Effort normal and breath sounds normal. No respiratory distress. She has no wheezes. She has no rales.  Abdominal: Soft. Bowel sounds are normal. She exhibits no distension. There is no tenderness.  Lymphadenopathy:    She has no cervical adenopathy.  Neurological: She is alert and oriented to person, place, and time. She has normal reflexes.  Skin: Skin is warm and dry.  Psychiatric: Judgment and thought content normal. Her mood appears anxious. Her affect is labile. Her speech is rapid and/or pressured. She is agitated. Cognition and memory are normal. She exhibits a depressed mood.    Assessment & Plan:   Megan Jenkins was seen today for establish care.  Diagnoses and all orders for this visit:  Depression -     CBC with Differential/Platelet -     CMP14+EGFR -     TSH  Other orders -     DULoxetine (CYMBALTA) 30 MG capsule;  Take 1 capsule (30 mg total) by mouth daily. For one week then two daily. Take with a full stomach at suppertime   I have discontinued Ms. Zakarian's ibuprofen, HYDROcodone-acetaminophen, and methocarbamol. I am also having her start on DULoxetine.  Meds ordered this encounter  Medications  . DULoxetine (CYMBALTA) 30 MG capsule    Sig: Take 1 capsule (30 mg total) by mouth daily. For one week then two daily. Take with a full  stomach at suppertime    Dispense:  60 capsule    Refill:  0     Follow-up: Return in about 4 weeks (around 01/16/2016).  Claretta Fraise, M.D.

## 2015-12-20 LAB — CMP14+EGFR
A/G RATIO: 1.6 (ref 1.2–2.2)
ALBUMIN: 4.3 g/dL (ref 3.5–5.5)
ALK PHOS: 68 IU/L (ref 39–117)
ALT: 34 IU/L — ABNORMAL HIGH (ref 0–32)
AST: 15 IU/L (ref 0–40)
BILIRUBIN TOTAL: 0.2 mg/dL (ref 0.0–1.2)
BUN / CREAT RATIO: 16 (ref 9–23)
BUN: 9 mg/dL (ref 6–20)
CHLORIDE: 102 mmol/L (ref 96–106)
CO2: 22 mmol/L (ref 18–29)
Calcium: 9.1 mg/dL (ref 8.7–10.2)
Creatinine, Ser: 0.56 mg/dL — ABNORMAL LOW (ref 0.57–1.00)
GFR calc non Af Amer: 128 mL/min/{1.73_m2} (ref >59)
GFR, EST AFRICAN AMERICAN: 148 mL/min/{1.73_m2} (ref >59)
GLOBULIN, TOTAL: 2.7 g/dL (ref 1.5–4.5)
GLUCOSE: 88 mg/dL (ref 65–99)
POTASSIUM: 4.3 mmol/L (ref 3.5–5.2)
SODIUM: 141 mmol/L (ref 134–144)
Total Protein: 7 g/dL (ref 6.0–8.5)

## 2015-12-20 LAB — CBC WITH DIFFERENTIAL/PLATELET
BASOS ABS: 0 10*3/uL (ref 0.0–0.2)
BASOS: 0 %
EOS (ABSOLUTE): 0 10*3/uL (ref 0.0–0.4)
Eos: 1 %
HEMOGLOBIN: 13 g/dL (ref 11.1–15.9)
Hematocrit: 38.4 % (ref 34.0–46.6)
Immature Grans (Abs): 0 10*3/uL (ref 0.0–0.1)
Immature Granulocytes: 0 %
LYMPHS ABS: 2.1 10*3/uL (ref 0.7–3.1)
Lymphs: 40 %
MCH: 27.6 pg (ref 26.6–33.0)
MCHC: 33.9 g/dL (ref 31.5–35.7)
MCV: 82 fL (ref 79–97)
MONOCYTES: 10 %
Monocytes Absolute: 0.6 10*3/uL (ref 0.1–0.9)
NEUTROS ABS: 2.6 10*3/uL (ref 1.4–7.0)
Neutrophils: 49 %
Platelets: 310 10*3/uL (ref 150–379)
RBC: 4.71 x10E6/uL (ref 3.77–5.28)
RDW: 14.4 % (ref 12.3–15.4)
WBC: 5.3 10*3/uL (ref 3.4–10.8)

## 2015-12-20 LAB — TSH: TSH: 0.562 u[IU]/mL (ref 0.450–4.500)

## 2016-01-22 ENCOUNTER — Ambulatory Visit: Payer: BLUE CROSS/BLUE SHIELD | Admitting: Family Medicine

## 2016-01-23 ENCOUNTER — Encounter: Payer: Self-pay | Admitting: Family Medicine

## 2016-04-29 ENCOUNTER — Encounter: Payer: BLUE CROSS/BLUE SHIELD | Admitting: Family Medicine

## 2016-05-01 ENCOUNTER — Encounter: Payer: Self-pay | Admitting: Family Medicine

## 2017-02-09 ENCOUNTER — Encounter: Payer: Self-pay | Admitting: Family Medicine

## 2017-02-09 ENCOUNTER — Ambulatory Visit (INDEPENDENT_AMBULATORY_CARE_PROVIDER_SITE_OTHER): Payer: BLUE CROSS/BLUE SHIELD | Admitting: Family Medicine

## 2017-02-09 DIAGNOSIS — F4321 Adjustment disorder with depressed mood: Secondary | ICD-10-CM | POA: Diagnosis not present

## 2017-02-09 DIAGNOSIS — E663 Overweight: Secondary | ICD-10-CM | POA: Diagnosis not present

## 2017-02-09 MED ORDER — FLUOXETINE HCL 20 MG PO TABS: 20.0000 mg | ORAL_TABLET | Freq: Every day | ORAL | 3 refills | Status: DC

## 2017-02-09 NOTE — Progress Notes (Signed)
Chief Complaint  Patient presents with  . Follow-up   Healthy young mother On no medicine Here to establish care  Is easily upset and feels she needs treatment for depression.  She was extremely close to her maternal GM who died suddenly a few months ago at the age of 29.  She still cries about it every day and does not feel she is recovering.  We discussed grief.  She may consider counseling.  She would like medicine to help.  Had yawning on duloxetine and wants to try a different SSRI. Says health maintenance up to date We discussed her weight, diet, portions, snacks, daily exercise recommended.   Patient Active Problem List   Diagnosis Date Noted  . Patient overweight 02/09/2017  . Situational depression 02/09/2017    Outpatient Encounter Prescriptions as of 02/09/2017  Medication Sig  . FLUoxetine (PROZAC) 20 MG tablet Take 1 tablet (20 mg total) by mouth daily.  . [DISCONTINUED] DULoxetine (CYMBALTA) 30 MG capsule Take 1 capsule (30 mg total) by mouth daily. For one week then two daily. Take with a full stomach at suppertime (Patient not taking: Reported on 02/09/2017)   No facility-administered encounter medications on file as of 02/09/2017.     Past Medical History:  Diagnosis Date  . Abnormal Pap smear 2012   No treatment, "Just watching it q 6 months"  . Allergy   . Anemia   . Anxiety   . Depression     Past Surgical History:  Procedure Laterality Date  . NO PAST SURGERIES    . TUBAL LIGATION      Social History   Social History  . Marital status: Significant Other    Spouse name: Casimiro NeedleMichael  . Number of children: 2  . Years of education: 14   Occupational History  . resident care coordinator     assisted living   Social History Main Topics  . Smoking status: Never Smoker  . Smokeless tobacco: Never Used  . Alcohol use No  . Drug use: No  . Sexual activity: Yes    Birth control/ protection: Surgical   Other Topics Concern  . Not on file   Social  History Narrative   Lives with significant other, Casimiro NeedleMichael   Two children    Family History  Problem Relation Age of Onset  . Hypertension Mother   . Diabetes Mother   . Cancer Maternal Uncle        stomach cancer  . Heart disease Maternal Grandmother [id number]       chf    Review of Systems  Constitutional: Negative for chills, fever and weight loss.  HENT: Negative for congestion and hearing loss.   Eyes: Negative for blurred vision and pain.  Respiratory: Negative for cough and shortness of breath.   Cardiovascular: Negative for chest pain and leg swelling.  Gastrointestinal: Negative for abdominal pain, constipation, diarrhea and heartburn.  Genitourinary: Negative for dysuria and frequency.  Musculoskeletal: Negative for falls, joint pain and myalgias.  Neurological: Negative for dizziness, seizures and headaches.  Psychiatric/Behavioral: Positive for depression. Negative for suicidal ideas. The patient is nervous/anxious and has insomnia.     BP 138/84 (BP Location: Right Arm, Patient Position: Sitting, Cuff Size: Normal)   Pulse 92   Temp 98.3 F (36.8 C) (Temporal)   Resp 18   Ht 5' 1.5" (1.562 m)   Wt 233 lb 1.9 oz (105.7 kg)   LMP 01/06/2017 (Exact Date)   SpO2 99%   BMI  43.33 kg/m   Physical Exam  Constitutional: She is oriented to person, place, and time. She appears well-developed and well-nourished.  HENT:  Head: Normocephalic and atraumatic.  Mouth/Throat: Oropharynx is clear and moist.  Eyes: Conjunctivae are normal. Pupils are equal, round, and reactive to light.  Neck: Normal range of motion. Neck supple. No thyromegaly present.  Cardiovascular: Normal rate, regular rhythm and normal heart sounds.   Pulmonary/Chest: Effort normal and breath sounds normal. No respiratory distress.  Musculoskeletal: Normal range of motion. She exhibits no edema.  Lymphadenopathy:    She has no cervical adenopathy.  Neurological: She is alert and oriented to person, place,  and time.  Gait normal  Skin: Skin is warm and dry.  Psychiatric: Her behavior is normal. Thought content normal.  labile  Nursing note and vitals reviewed. ASSESSMENT/PLAN:   1. Patient overweight  - COMPLETE METABOLIC PANEL WITH GFR - Lipid panel - TSH - Urinalysis, Routine w reflex microscopic  2. Situational depression  - COMPLETE METABOLIC PANEL WITH GFR - Lipid panel - TSH - Urinalysis, Routine w reflex microscopic Greater than 50% of this visit was spent in counseling and coordinating care.  Total face to face time:   30 min spent discussing treatment for depression, medicines, therapy, exercise  Patient Instructions  Need blood work  Take the fluoxetine daily in the morning  Try to walk or get some exercise daily  See me in one month  Call sooner for problems   Eustace Moore, MD

## 2017-02-09 NOTE — Patient Instructions (Signed)
Need blood work  Take the fluoxetine daily in the morning  Try to walk or get some exercise daily  See me in one month  Call sooner for problems

## 2017-02-10 LAB — COMPLETE METABOLIC PANEL WITH GFR
ALBUMIN: 4.3 g/dL (ref 3.6–5.1)
ALK PHOS: 54 U/L (ref 33–115)
ALT: 25 U/L (ref 6–29)
AST: 16 U/L (ref 10–30)
BILIRUBIN TOTAL: 0.3 mg/dL (ref 0.2–1.2)
BUN: 10 mg/dL (ref 7–25)
CALCIUM: 8.9 mg/dL (ref 8.6–10.2)
CO2: 23 mmol/L (ref 20–31)
Chloride: 106 mmol/L (ref 98–110)
Creat: 0.71 mg/dL (ref 0.50–1.10)
GFR, Est African American: >89 mL/min (ref >=60)
Glucose, Bld: 82 mg/dL (ref 65–99)
POTASSIUM: 3.6 mmol/L (ref 3.5–5.3)
Sodium: 139 mmol/L (ref 135–146)
TOTAL PROTEIN: 6.8 g/dL (ref 6.1–8.1)

## 2017-02-10 LAB — URINALYSIS, MICROSCOPIC ONLY
CRYSTALS: NONE SEEN [HPF]
Casts: NONE SEEN [LPF]
Yeast: NONE SEEN [HPF]

## 2017-02-10 LAB — LIPID PANEL
CHOLESTEROL: 150 mg/dL (ref <200)
HDL: 38 mg/dL — AB (ref >50)
LDL Cholesterol: 81 mg/dL (ref <100)
Total CHOL/HDL Ratio: 3.9 Ratio (ref <5.0)
Triglycerides: 153 mg/dL — ABNORMAL HIGH (ref <150)
VLDL: 31 mg/dL — ABNORMAL HIGH (ref <30)

## 2017-02-10 LAB — URINALYSIS, ROUTINE W REFLEX MICROSCOPIC
Bilirubin Urine: NEGATIVE
Glucose, UA: NEGATIVE
Hgb urine dipstick: NEGATIVE
Ketones, ur: NEGATIVE
Nitrite: NEGATIVE
PROTEIN: NEGATIVE
SPECIFIC GRAVITY, URINE: 1.031 (ref 1.001–1.035)
pH: 6.5 (ref 5.0–8.0)

## 2017-02-10 LAB — TSH: TSH: 0.8 m[IU]/L

## 2017-03-12 ENCOUNTER — Ambulatory Visit (INDEPENDENT_AMBULATORY_CARE_PROVIDER_SITE_OTHER): Payer: BLUE CROSS/BLUE SHIELD | Admitting: Family Medicine

## 2017-03-12 ENCOUNTER — Encounter: Payer: Self-pay | Admitting: Family Medicine

## 2017-03-12 VITALS — BP 126/78 | HR 76 | Temp 98.6°F | Resp 16 | Ht 62.0 in | Wt 233.1 lb

## 2017-03-12 DIAGNOSIS — F4321 Adjustment disorder with depressed mood: Secondary | ICD-10-CM | POA: Diagnosis not present

## 2017-03-12 NOTE — Patient Instructions (Signed)
Continue fluoxetine once a day Take in the evening before bed Try tow alk every day See me in a month or two

## 2017-03-12 NOTE — Progress Notes (Signed)
    Chief Complaint  Patient presents with  . Follow-up    1 month   Fluoxetine not helping yet Feels it is making her tired Is advised to take at night Discussed importance of diet and exercise  Patient Active Problem List   Diagnosis Date Noted  . Patient overweight 02/09/2017  . Situational depression 02/09/2017    Outpatient Encounter Prescriptions as of 03/12/2017  Medication Sig  . FLUoxetine (PROZAC) 20 MG tablet Take 1 tablet (20 mg total) by mouth daily.   No facility-administered encounter medications on file as of 03/12/2017.     Allergies  Allergen Reactions  . Penicillins Other (See Comments)    Unknown reactin--childhood     Review of Systems  Constitutional: Positive for fatigue. Negative for activity change, appetite change and unexpected weight change.  HENT: Negative for congestion, dental problem, postnasal drip and rhinorrhea.   Eyes: Negative for redness and visual disturbance.  Respiratory: Negative for cough and shortness of breath.   Cardiovascular: Negative for chest pain, palpitations and leg swelling.  Gastrointestinal: Negative for abdominal pain, constipation and diarrhea.  Genitourinary: Negative for difficulty urinating, frequency and menstrual problem.  Musculoskeletal: Negative for arthralgias and back pain.  Neurological: Negative for dizziness and headaches.  Psychiatric/Behavioral: Negative for dysphoric mood and sleep disturbance. The patient is nervous/anxious.     BP 126/78 (BP Location: Right Arm, Patient Position: Sitting, Cuff Size: Normal)   Pulse 76   Temp 98.6 F (37 C) (Temporal)   Resp 16   Ht '5\' 2"'$  (1.575 m)   Wt 233 lb 1.9 oz (105.7 kg)   LMP 02/01/2017 (Approximate)   SpO2 98%   BMI 42.64 kg/m   Physical Exam  Constitutional: She is oriented to person, place, and time. She appears well-developed and well-nourished.  HENT:  Head: Normocephalic and atraumatic.  Mouth/Throat: Oropharynx is clear and moist.    Eyes: Pupils are equal, round, and reactive to light. Conjunctivae are normal.  Neck: Normal range of motion. Neck supple. No thyromegaly present.  Cardiovascular: Normal rate, regular rhythm and normal heart sounds.   Pulmonary/Chest: Effort normal and breath sounds normal. No respiratory distress.  Musculoskeletal: Normal range of motion. She exhibits no edema.  Lymphadenopathy:    She has no cervical adenopathy.  Neurological: She is alert and oriented to person, place, and time.  Gait normal  Skin: Skin is warm and dry.  Psychiatric: Her behavior is normal. Thought content normal.  Nursing note and vitals reviewed.   ASSESSMENT/PLAN:  1. Situational depression discussed   Patient Instructions  Continue fluoxetine once a day Take in the evening before bed Try tow alk every day See me in a month or two   Raylene Everts, MD

## 2017-05-14 ENCOUNTER — Telehealth: Payer: Self-pay | Admitting: Family Medicine

## 2017-05-14 ENCOUNTER — Ambulatory Visit: Payer: BLUE CROSS/BLUE SHIELD | Admitting: Family Medicine

## 2017-05-14 NOTE — Telephone Encounter (Signed)
Patient left message on nurse line cancelling her appt for 05-14-17 and calling to reschedule. I returned call and left message on voice mail for patient to call and reschedule appointment.

## 2017-07-01 ENCOUNTER — Telehealth: Payer: Self-pay | Admitting: *Deleted

## 2017-07-01 NOTE — Telephone Encounter (Signed)
Can see her tomorrow.  She can be a "work in" in the am.

## 2017-07-01 NOTE — Telephone Encounter (Signed)
Patient is scheduled for in the morning at 8:00, I made patient aware to arrive 7:45.

## 2017-07-01 NOTE — Telephone Encounter (Signed)
Patient called stating she thinks she has strep throat and she started having abdominal pain and nausea last night. Patient states she has been taking tylenol serve cold and has had a fever off and on. Please advise [phone]

## 2017-07-02 ENCOUNTER — Ambulatory Visit: Payer: BLUE CROSS/BLUE SHIELD | Admitting: Family Medicine

## 2017-11-02 ENCOUNTER — Encounter: Payer: Self-pay | Admitting: Family Medicine

## 2017-11-18 ENCOUNTER — Telehealth: Payer: Self-pay | Admitting: Family Medicine

## 2017-11-18 ENCOUNTER — Ambulatory Visit: Payer: BLUE CROSS/BLUE SHIELD | Admitting: Family Medicine

## 2017-12-14 ENCOUNTER — Telehealth: Payer: Self-pay | Admitting: Family Medicine

## 2017-12-14 NOTE — Telephone Encounter (Signed)
Returned certified mail - not picked up.

## 2019-08-28 ENCOUNTER — Telehealth: Payer: BLUE CROSS/BLUE SHIELD

## 2019-08-30 ENCOUNTER — Ambulatory Visit: Payer: BLUE CROSS/BLUE SHIELD | Attending: Internal Medicine

## 2020-12-17 ENCOUNTER — Encounter (HOSPITAL_COMMUNITY): Payer: Self-pay | Admitting: Emergency Medicine

## 2020-12-17 ENCOUNTER — Other Ambulatory Visit: Payer: Self-pay

## 2020-12-17 ENCOUNTER — Emergency Department (HOSPITAL_COMMUNITY)
Admission: EM | Admit: 2020-12-17 | Discharge: 2020-12-17 | Disposition: A | Discharge status: Home / Self Care | Payer: 59 | Attending: Emergency Medicine | Admitting: Emergency Medicine

## 2020-12-17 ENCOUNTER — Emergency Department (HOSPITAL_COMMUNITY): Payer: 59

## 2020-12-17 DIAGNOSIS — Z79899 Other long term (current) drug therapy: Secondary | ICD-10-CM | POA: Insufficient documentation

## 2020-12-17 DIAGNOSIS — I10 Essential (primary) hypertension: Secondary | ICD-10-CM | POA: Diagnosis not present

## 2020-12-17 DIAGNOSIS — Z8719 Personal history of other diseases of the digestive system: Secondary | ICD-10-CM | POA: Insufficient documentation

## 2020-12-17 DIAGNOSIS — Z880 Allergy status to penicillin: Secondary | ICD-10-CM | POA: Diagnosis not present

## 2020-12-17 DIAGNOSIS — R1030 Lower abdominal pain, unspecified: Secondary | ICD-10-CM | POA: Diagnosis present

## 2020-12-17 DIAGNOSIS — E876 Hypokalemia: Secondary | ICD-10-CM | POA: Diagnosis not present

## 2020-12-17 DIAGNOSIS — R102 Pelvic and perineal pain: Secondary | ICD-10-CM | POA: Insufficient documentation

## 2020-12-17 DIAGNOSIS — N838 Other noninflammatory disorders of ovary, fallopian tube and broad ligament: Secondary | ICD-10-CM | POA: Insufficient documentation

## 2020-12-17 HISTORY — DX: Diverticulitis of intestine, part unspecified, without perforation or abscess without bleeding: K57.92

## 2020-12-17 HISTORY — DX: Essential (primary) hypertension: I10

## 2020-12-17 LAB — COMPREHENSIVE METABOLIC PANEL
ALT: 23 U/L (ref 0–44)
AST: 21 U/L (ref 15–41)
Albumin: 4.2 g/dL (ref 3.5–5.0)
Alkaline Phosphatase: 45 U/L (ref 38–126)
Anion gap: 10 (ref 5–15)
BUN: 9 mg/dL (ref 6–20)
CO2: 28 mmol/L (ref 22–32)
Calcium: 9 mg/dL (ref 8.9–10.3)
Chloride: 99 mmol/L (ref 98–111)
Creatinine, Ser: 0.62 mg/dL (ref 0.44–1.00)
GFR, Estimated: >60 mL/min (ref >60)
Glucose, Bld: 84 mg/dL (ref 70–99)
Potassium: 2.9 mmol/L — ABNORMAL LOW (ref 3.5–5.1)
Sodium: 137 mmol/L (ref 135–145)
Total Bilirubin: 0.6 mg/dL (ref 0.3–1.2)
Total Protein: 7.6 g/dL (ref 6.5–8.1)

## 2020-12-17 LAB — CBC WITH DIFFERENTIAL/PLATELET
Abs Immature Granulocytes: 0.01 10*3/uL (ref 0.00–0.07)
Basophils Absolute: 0 10*3/uL (ref 0.0–0.1)
Basophils Relative: 1 %
Eosinophils Absolute: 0 10*3/uL (ref 0.0–0.5)
Eosinophils Relative: 1 %
HCT: 39.8 % (ref 36.0–46.0)
Hemoglobin: 12.5 g/dL (ref 12.0–15.0)
Immature Granulocytes: 0 %
Lymphocytes Relative: 28 %
Lymphs Abs: 1.8 10*3/uL (ref 0.7–4.0)
MCH: 26.7 pg (ref 26.0–34.0)
MCHC: 31.4 g/dL (ref 30.0–36.0)
MCV: 84.9 fL (ref 80.0–100.0)
Monocytes Absolute: 0.6 10*3/uL (ref 0.1–1.0)
Monocytes Relative: 9 %
Neutro Abs: 4 10*3/uL (ref 1.7–7.7)
Neutrophils Relative %: 61 %
Platelets: 258 10*3/uL (ref 150–400)
RBC: 4.69 MIL/uL (ref 3.87–5.11)
RDW: 14.9 % (ref 11.5–15.5)
WBC: 6.5 10*3/uL (ref 4.0–10.5)
nRBC: 0 % (ref 0.0–0.2)

## 2020-12-17 LAB — URINALYSIS, ROUTINE W REFLEX MICROSCOPIC
Bacteria, UA: NONE SEEN
Bilirubin Urine: NEGATIVE
Glucose, UA: NEGATIVE mg/dL
Hgb urine dipstick: NEGATIVE
Ketones, ur: NEGATIVE mg/dL
Nitrite: NEGATIVE
Protein, ur: NEGATIVE mg/dL
Specific Gravity, Urine: 1.016 (ref 1.005–1.030)
pH: 7 (ref 5.0–8.0)

## 2020-12-17 LAB — I-STAT BETA HCG BLOOD, ED (MC, WL, AP ONLY): I-stat hCG, quantitative: <5 m[IU]/mL (ref <5)

## 2020-12-17 LAB — LIPASE, BLOOD: Lipase: 26 U/L (ref 11–51)

## 2020-12-17 MED ORDER — POTASSIUM CHLORIDE ER 10 MEQ PO TBCR: 10.0000 meq | EXTENDED_RELEASE_TABLET | Freq: Two times a day (BID) | ORAL | 0 refills | Status: DC

## 2020-12-17 MED ORDER — KETOROLAC TROMETHAMINE 30 MG/ML IJ SOLN
30.0000 mg | Freq: Once | INTRAMUSCULAR | Status: AC
  Administered 2020-12-17: 30 mg via INTRAVENOUS
  Filled 2020-12-17: qty 1

## 2020-12-17 NOTE — ED Triage Notes (Signed)
Pt complains of intermittent lower abdominal pain since Saturday.  Pt Denies N/V/D/F and urinary symptoms.

## 2020-12-17 NOTE — ED Provider Notes (Signed)
[facility] EMERGENCY DEPARTMENT Provider Note   CSN: [id number] Arrival date & time: 12/17/20  6387     History Chief Complaint  Patient presents with  . Abdominal Pain    Megan Jenkins is a 35 y.o. female presenting for evaluation of lower abdominal pain.  Patient states her symptoms began 2 days ago.  It is described as a cramping pain in the lower abdomen.  It is intermittent, gradually worsening.  Pain is worse with p.o. intake and movement.  Nothing makes it better.  Pain does not radiate.  She is not taken anything including Tylenol or ibuprofen.  She denies associated fevers, chills, nausea, vomiting, urinary symptoms, abnormal bowel movements.  Her last period was approximately 10 days ago, was the right time and right amount.  She has been worked up by her OB/GYN for painful and abnormal uterine bleeding, which has worsened over the past 6 months.  Plans for pelvic ultrasound.  She had a pelvic exam last week with her OB/GYN, was overall reassuring.  She denies recent medication changes.  Has had a tubal ligation, but no other abdominal problems or surgeries.  Additional history obtained from chart review.  Patient with a history of anxiety, depression, hypertension.  She does have a history of diverticulitis, but states this feels different.  She has taken her blood pressure medication today, however she states she is always taking it regularly, and her PCP is following this.  HPI     Past Medical History:  Diagnosis Date  . Abnormal Pap smear 2012   No treatment, "Just watching it q 6 months"  . Allergy   . Anemia   . Anxiety   . Depression   . Diverticulitis   . Hypertension     Patient Active Problem List   Diagnosis Date Noted  . Patient overweight 02/09/2017  . Situational depression 02/09/2017    Past Surgical History:  Procedure Laterality Date  . NO PAST SURGERIES    . TUBAL LIGATION       OB History    Gravida  3   Para  1   Term  1   Preterm       AB  2   Living  1     SAB  2   IAB      Ectopic      Multiple      Live Births  1           Family History  Problem Relation Age of Onset  . Hypertension Mother   . Diabetes Mother   . Cancer Maternal Uncle        stomach cancer  . Heart disease Maternal Grandmother 47       chf    Social History   Tobacco Use  . Smoking status: Never Smoker  . Smokeless tobacco: Never Used  Vaping Use  . Vaping Use: Never used  Substance Use Topics  . Alcohol use: No    Comment: occ  . Drug use: No    Home Medications Prior to Admission medications   Medication Sig Start Date End Date Taking? Authorizing Provider  ferrous sulfate 325 (65 FE) MG tablet Take 325 mg by mouth daily with breakfast.   Yes [provider]  hydrochlorothiazide (HYDRODIURIL) 12.5 MG tablet Take 12.5 mg by mouth daily. 12/13/20  Yes [provider]  phentermine (ADIPEX-P) 37.5 MG tablet Take 37.5 mg by mouth at bedtime. 12/07/20  Yes [provider]  potassium chloride (KLOR-CON) 10 MEQ tablet Take 1 tablet (10 mEq total) by mouth 2 (two) times daily for 7 days. 12/17/20 12/24/20 Yes Loralie Malta, PA-C    Allergies    Penicillins  Review of Systems   Review of Systems  Gastrointestinal: Positive for abdominal pain.  All other systems reviewed and are negative.   Physical Exam Updated Vital Signs BP (!) 156/108   Pulse 77   Temp 98 F (36.7 C) (Oral)   Resp 18   Ht 5\' 4"  (1.626 m)   Wt 84.8 kg   LMP 12/07/2020 (Exact Date)   SpO2 99%   BMI 32.10 kg/m   Physical Exam Vitals and nursing note reviewed.  Constitutional:      General: She is not in acute distress.    Appearance: She is well-developed.  HENT:     Head: Normocephalic and atraumatic.  Eyes:     Conjunctiva/sclera: Conjunctivae normal.     Pupils: Pupils are equal, round, and reactive to light.  Cardiovascular:     Rate and Rhythm: Normal rate and regular rhythm.     Pulses: Normal  pulses.  Pulmonary:     Effort: Pulmonary effort is normal. No respiratory distress.     Breath sounds: Normal breath sounds. No wheezing.  Abdominal:     General: There is no distension.     Palpations: Abdomen is soft. There is no mass.     Tenderness: There is abdominal tenderness. There is no guarding or rebound.     Comments: Mild tenderness palpation of suprapubic/lower abdomen.  No rigidity, guarding, distention.  Negative rebound.  No peritonitis.  No CVA tenderness.  Musculoskeletal:        General: Normal range of motion.     Cervical back: Normal range of motion and neck supple.  Skin:    General: Skin is warm and dry.  Neurological:     Mental Status: She is alert and oriented to person, place, and time.     ED Results / Procedures / Treatments   Labs (all labs ordered are listed, but only abnormal results are displayed) Labs Reviewed  COMPREHENSIVE METABOLIC PANEL - Abnormal; Notable for the following components:      Result Value   Potassium 2.9 (*)    All other components within normal limits  URINALYSIS, ROUTINE W REFLEX MICROSCOPIC - Abnormal; Notable for the following components:   APPearance HAZY (*)    Leukocytes,Ua MODERATE (*)    All other components within normal limits  CBC WITH DIFFERENTIAL/PLATELET  LIPASE, BLOOD  I-STAT BETA HCG BLOOD, ED ([facility], [facility], [facility] ONLY)    EKG None  Radiology 12/09/2020 PELVIC COMPLETE WITH TRANSVAGINAL  Result Date: 12/17/2020 CLINICAL DATA:  Pelvic pain EXAM: TRANSABDOMINAL AND TRANSVAGINAL ULTRASOUND OF PELVIS TECHNIQUE: Both transabdominal and transvaginal ultrasound examinations of the pelvis were performed. Transabdominal technique was performed for global imaging of the pelvis including uterus, ovaries, adnexal regions, and pelvic cul-de-sac. It was necessary to proceed with endovaginal exam following the transabdominal exam to visualize the uterus, endometrium and ovaries. COMPARISON:  None FINDINGS: Uterus Measurements: 14.2 x  6.3 by 6.4 cm = volume: 238.6 mL. No fibroids or other mass visualized. Endometrium Thickness: 13.4.  No focal abnormality visualized. Right ovary Measurements: 3.4 x 2.5 x 3.9 cm = volume: 17.4 mL. Normal appearance/no adnexal mass. Left ovary Measurements: 5.5 x 3.2 x 3.4 cm = volume: 34.5 mL. Multiple prominent peripheral follicles are identified resulting in string of pearls appearance of the  left ovary. Other findings No abnormal free fluid. IMPRESSION: 1. No acute findings.  No explanation for patient's pelvic pain 2. Bilateral ovary enlargement. No focal lesion identified. Multiple prominent follicles are noted around the periphery of the left ovary. Correlate for any clinical signs or symptoms of polycystic ovary syndrome. Electronically Signed   By: Signa Kell M.D.   On: 12/17/2020 14:01    Procedures Procedures   Medications Ordered in ED Medications  ketorolac (TORADOL) 30 MG/ML injection 30 mg (30 mg Intravenous Given 12/17/20 8101)    ED Course  I have reviewed the triage vital signs and the nursing notes.  Pertinent labs & imaging results that were available during my care of the patient were reviewed by me and considered in my medical decision making (see chart for details).    MDM Rules/Calculators/A&P                          Patient presented for evaluation of lower abdominal pain.  On exam, patient peers nontoxic.  She does have mild tenderness palpation of the lower abdomen.  However without fevers, chills, nausea, vomiting, or stool abnormalities, lower suspicion for infection.  Without urinary symptoms, doubt UTI.  Consider kidney stone.  Consider uterine fibroids.  Less likely Gyn infection such as PID/TOA as patient is without vaginal discharge and recently had a normal pelvic exam.  Consider viral GI illness.  Consider MSK pain.  Will obtain labs, urine, pregnancy test and reassess.  Labs interpreted by me, overall reassuring.  There is hypokalemia, will replenish  orally.  No leukocytosis.  On reassessment, patient reports pain is improved.  She continues to have mild tenderness with palpation, but improved from previous.  Discussed options of symptomatic treatment versus imaging today in the ED.  Patient is requesting imaging.  Will obtain ultrasound.  Low suspicion for GI cause such as appendicitis, I do not believe she needs CT scan.  Ultrasound shows bilateral ovarian enlargement, possible PCOS.  Otherwise is negative.  On reassessment, patient remains without significant pain.  Discussed findings with patient.  Discussed follow-up with OB/GYN.  At this time, patient appears safe for discharge.  Return precautions given.  Patient states she understands and agrees to plan.  Final Clinical Impression(s) / ED Diagnoses Final diagnoses:  Lower abdominal pain  Hypokalemia    Rx / DC Orders ED Discharge Orders         Ordered    potassium chloride (KLOR-CON) 10 MEQ tablet  2 times daily        12/17/20 1404           [place], Ayyub Krall, PA-C 12/17/20 1455    Long, Arlyss Repress, MD 12/18/20 [phone]

## 2020-12-17 NOTE — ED Notes (Signed)
Patient transported to ultrasound.

## 2020-12-17 NOTE — ED Notes (Signed)
Pt put on monitor EKG done

## 2020-12-17 NOTE — Discharge Instructions (Signed)
Take ibuprofen 3 times a day with meals as needed for pain. .  Do not take other anti-inflammatories at the same time open (Advil, Motrin, naproxen, Aleve). You may supplement with Tylenol if you need further pain control. Use heating pads to help control your pain. Take potassium twice a day for the next week. Call your OB/GYN for further evaluation of your symptoms.  Return to the emergency room if develop high fevers, persistent vomiting, severe worsening abdominal pain, or any new or worsening, concerning symptoms.

## 2020-12-27 ENCOUNTER — Other Ambulatory Visit: Payer: Self-pay

## 2020-12-27 ENCOUNTER — Ambulatory Visit (INDEPENDENT_AMBULATORY_CARE_PROVIDER_SITE_OTHER): Payer: 59 | Admitting: Obstetrics & Gynecology

## 2020-12-27 ENCOUNTER — Encounter: Payer: Self-pay | Admitting: Obstetrics & Gynecology

## 2020-12-27 VITALS — BP 151/84 | HR 101 | Ht 62.0 in | Wt 190.4 lb

## 2020-12-27 DIAGNOSIS — I10 Essential (primary) hypertension: Secondary | ICD-10-CM

## 2020-12-27 DIAGNOSIS — N939 Abnormal uterine and vaginal bleeding, unspecified: Secondary | ICD-10-CM | POA: Diagnosis not present

## 2020-12-27 MED ORDER — TRANEXAMIC ACID 650 MG PO TABS: 1300.0000 mg | ORAL_TABLET | Freq: Three times a day (TID) | ORAL | 6 refills | Status: AC

## 2020-12-27 NOTE — Progress Notes (Signed)
   GYN VISIT Patient name: Megan Jenkins MRN [id number]  Date of birth: September 11, 1985 Chief Complaint:   Menorrhagia  History of Present Illness:   Megan Jenkins is a 35 y.o. [phone]  female being seen today for the following:  HMB: Menses have been heavy since 2015 and continue to get worse especially the last 12mos.  Menses last for 5 days- on first 3 days change q 30-1hr- 2pads +tampon.  Also notes clots the size of small apples. Denies intermenstrual bleeding.  Denies dysmenorrhea.  Denies postcoital bleeding.  No prior medication.   Other than heavy periods, no acute complaints  Records reviewed- [place] completed 12/20/20: 14.2cm uterus with no abnormalities.  Ovaries suggestive of PCOS  Thyroid test: 11/2020- TSH 0.44 (cut off 0.45) normal freeT4 CBC 11/20/18: Hgb 12.9  Patient's last menstrual period was 12/07/2020 (exact date).  Depression screen [facility] 2/9 12/27/2020 02/09/2017 12/19/2015  Decreased Interest 0 0 2  Down, Depressed, Hopeless 0 0 3  PHQ - 2 Score 0 0 5  Altered sleeping 3 - 3  Tired, decreased energy 2 - 2  Change in appetite 0 - 3  Feeling bad or failure about yourself  0 - 2  Trouble concentrating 1 - 2  Moving slowly or fidgety/restless 0 - 0  Suicidal thoughts 0 - 0  PHQ-9 Score 6 - 17  Difficult doing work/chores - - Very difficult     Review of Systems:   Pertinent items are noted in HPI Denies fever/chills, dizziness, headaches, visual disturbances, fatigue, shortness of breath, chest pain, abdominal pain, vomiting, bowel movements, urination, or intercourse unless otherwise stated above.  Pertinent History Reviewed:  Reviewed past medical,surgical, social, obstetrical and family history.  Reviewed problem list, medications and allergies. Physical Assessment:   Vitals:   12/27/20 0848  BP: (!) 151/84  Pulse: (!) 101  Weight: 190 lb 6.4 oz (86.4 kg)  Height: 5\' 2"  (1.575 m)  Body mass index is 34.82 kg/m.       Physical Examination:   General appearance: alert,  well appearing, and in no distress  Psych: mood appropriate, normal affect  Skin: warm & dry   Cardiovascular: RRR  Respiratory: normal respiratory effort, no distress, CTAB  Abdomen: soft, non-tender, uterus well below umbilicus  Pelvic: examination not indicated  Extremities: no edema   Chaperone: N/A    Assessment & Plan:  1) AUB -discussed potential causes and reviewed with patient likely AUB-O -due to uncontrolled HTN, advised avoiding estrogen -reviewed all options including TXA, POP, IUD, Depo, Nexplanon or surgical intervention like ablation or hysterectomy -questions/concerns were addressed- desires trial of TXA, may consider IUD in the future -f/u in 74mos   No orders of the defined types were placed in this encounter.   Return in about 3 months (around 03/29/2021) for Medication follow up.   05/29/2021, DO Attending Obstetrician & Gynecologist, [facility] for [facility]

## 2021-01-08 ENCOUNTER — Encounter (INDEPENDENT_AMBULATORY_CARE_PROVIDER_SITE_OTHER): Payer: Self-pay

## 2021-01-10 ENCOUNTER — Ambulatory Visit (INDEPENDENT_AMBULATORY_CARE_PROVIDER_SITE_OTHER): Payer: BLUE CROSS/BLUE SHIELD | Admitting: Nurse Practitioner

## 2021-01-31 ENCOUNTER — Ambulatory Visit (INDEPENDENT_AMBULATORY_CARE_PROVIDER_SITE_OTHER): Payer: 59 | Admitting: Nurse Practitioner

## 2021-01-31 ENCOUNTER — Encounter (INDEPENDENT_AMBULATORY_CARE_PROVIDER_SITE_OTHER): Payer: Self-pay | Admitting: Nurse Practitioner

## 2021-01-31 ENCOUNTER — Other Ambulatory Visit: Payer: Self-pay

## 2021-01-31 VITALS — BP 138/86 | HR 104 | Temp 97.9°F | Ht 61.0 in | Wt 192.2 lb

## 2021-01-31 DIAGNOSIS — R5383 Other fatigue: Secondary | ICD-10-CM | POA: Diagnosis not present

## 2021-01-31 DIAGNOSIS — D509 Iron deficiency anemia, unspecified: Secondary | ICD-10-CM

## 2021-01-31 DIAGNOSIS — K5790 Diverticulosis of intestine, part unspecified, without perforation or abscess without bleeding: Secondary | ICD-10-CM | POA: Insufficient documentation

## 2021-01-31 DIAGNOSIS — Z6836 Body mass index (BMI) 36.0-36.9, adult: Secondary | ICD-10-CM

## 2021-01-31 DIAGNOSIS — I10 Essential (primary) hypertension: Secondary | ICD-10-CM | POA: Diagnosis not present

## 2021-01-31 MED ORDER — AMLODIPINE BESYLATE 5 MG PO TABS: 10.0000 mg | ORAL_TABLET | Freq: Every day | ORAL | 1 refills | Status: AC

## 2021-01-31 NOTE — Patient Instructions (Signed)
[facility] Dietary Recommendations for Weight Loss What to Avoid Avoid added sugars Often added sugar can be found in processed foods such as many condiments, dry cereals, cakes, cookies, chips, crisps, crackers, candies, sweetened drinks, etc.  Read labels and AVOID/DECREASE use of foods with the following in their ingredient list: Sugar, fructose, high fructose corn syrup, sucrose, glucose, maltose, dextrose, molasses, cane sugar, brown sugar, any type of syrup, agave nectar, etc.   Avoid snacking in between meals Avoid foods made with flour If you are going to eat food made with flour, choose those made with whole-grains; and, minimize your consumption as much as is tolerable Avoid processed foods These foods are generally stocked in the middle of the grocery store. Focus on shopping on the perimeter of the grocery.  Avoid Meat  We recommend following a plant-based diet at [facility]. Thus, we recommend avoiding meat as a general rule. Consider eating beans, legumes, eggs, and/or dairy products for regular protein sources If you plan on eating meat limit to 4 ounces of meat at a time and choose lean options such as Fish, chicken, turkey. Avoid red meat intake such as pork and/or steak What to Include Vegetables GREEN LEAFY VEGETABLES: Kale, spinach, mustard greens, collard greens, cabbage, broccoli, etc. OTHER: Asparagus, cauliflower, eggplant, carrots, peas, Brussel sprouts, tomatoes, bell peppers, zucchini, beets, cucumbers, etc. Grains, seeds, and legumes Beans: kidney beans, black eyed peas, garbanzo beans, black beans, pinto beans, etc. Whole, unrefined grains: brown rice, barley, bulgur, oatmeal, etc. Healthy fats  Avoid highly processed fats such as vegetable oil Examples of healthy fats: avocado, olives, virgin olive oil, dark chocolate (?72% Cocoa), nuts (peanuts, almonds, walnuts, cashews, pecans, etc.) None to Low Intake of Animal Sources of Protein Meat  sources: chicken, turkey, salmon, tuna. Limit to 4 ounces of meat at one time. Consider limiting dairy sources, but when choosing dairy focus on: PLAIN Greek yogurt, cottage cheese, high-protein milk Fruit Choose berries  When to Eat Intermittent Fasting: Choosing not to eat for a specific time period, but DO FOCUS ON HYDRATION when fasting Multiple Techniques: Time Restricted Eating: eat 3 meals in a day, each meal lasting no more than 60 minutes, no snacks between meals 16-18 hour fast: fast for 16 to 18 hours up to 7 days a week. Often suggested to start with 2-3 nonconsecutive days per week.  Remember the time you sleep is counted as fasting.  Examples of eating schedule: Fast from 7:00pm-11:00am. Eat between 11:00am-7:00pm.  24-hour fast: fast for 24 hours up to every other day. Often suggested to start with 1 day per week Remember the time you sleep is counted as fasting Examples of eating schedule:  Eating day: eat 2-3 meals on your eating day. If doing 2 meals, each meal should last no more than 90 minutes. If doing 3 meals, each meal should last no more than 60 minutes. Finish last meal by 7:00pm. Fasting day: Fast until 7:00pm.  IF YOU FEEL UNWELL FOR ANY REASON/IN ANY WAY WHEN FASTING, STOP FASTING BY EATING A NUTRITIOUS SNACK OR LIGHT MEAL ALWAYS FOCUS ON HYDRATION DURING FASTS Acceptable Hydration sources: water, broths, tea/coffee (black tea/coffee is best but using a small amount of whole-fat dairy products in coffee/tea is acceptable).  Poor Hydration Sources: anything with sugar or artificial sweeteners added to it  These recommendations have been developed for patients that are actively receiving medical care from either Dr. [facility] or Amazing Cowman, DNP, NP-C at [facility]. These recommendations   are developed for patients with specific medical conditions and are not meant to be distributed or used by others that are not actively receiving care from either provider  listed above at [facility]. It is not appropriate to participate in the above eating plans without proper medical supervision.   Reference: Fung, J. The obesity code. [place]: [place]; 2016.   

## 2021-01-31 NOTE — Progress Notes (Signed)
Subjective:  Patient ID: Megan Jenkins, female    DOB: [birth year]  Age: 35 y.o. MRN: [id number]  CC:  Chief Complaint  Patient presents with   Establish Care    Patient is concerned about her blood pressure and her weight   Hypertension   Anemia   Obesity   Fatigue      HPI  This patient arrives today to establish care at this office.  Her main concern include her hypertension, obesity, and fatigue.  She also has a history of iron deficiency anemia, hypokalemia, and diverticulitis.  She was recently seen at an urgent care for abdominal pain which she thinks may have been a diverticulitis flare, she tells me prior to that she had not had a flareup in a long time.  She has a personal and family history of hypertension and is currently on hydrochlorothiazide.  She is prescribed 12.5 mg daily but she is been taking 25 mg daily.  She tells me that she still feels her blood pressure is under control on this dose and will have daily headaches.  She tells me when she checks her blood pressure with headaches her blood pressure is usually elevated around 140s-150s/90s.  She is being evaluated for heavy menstrual bleeding and is on medication for this and tells me that since starting this medication during her menstrual cycle her bleeding volume has decreased.  She believes this is the cause of her anemia.  She is not currently on any iron supplement but has been in the past.  She also would like to discuss weight loss and fatigue.  She is been try to lose weight has been on phentermine in the past, and tells me most recently she is been feeling quite tired and wonders if she may have some thyroid dysfunction.    Past Medical History:  Diagnosis Date   Abnormal Pap smear 2012   No treatment, "Just watching it q 6 months"   Allergy    Anemia    Anxiety    Depression    Diverticulitis    Hypertension       Family History  Problem Relation Age of Onset   Hypertension Mother    Diabetes  Mother    Cancer Maternal Uncle        stomach cancer   Heart disease Maternal Grandmother 9       chf    Social History   Social History Narrative   Lives with significant other, Legrand Como   Two children   Social History   Tobacco Use   Smoking status: Never   Smokeless tobacco: Never  Substance Use Topics   Alcohol use: No    Comment: occ     Current Meds  Medication Sig   amLODipine (NORVASC) 5 MG tablet Take 2 tablets (10 mg total) by mouth daily.   clonazePAM (KLONOPIN) 0.5 MG tablet Take 0.5 mg by mouth 2 (two) times daily as needed for anxiety.   tranexamic acid (LYSTEDA) 650 MG TABS tablet Take 2 tablets (1,300 mg total) by mouth 3 (three) times daily. During period as needed for bleeding   [DISCONTINUED] hydrochlorothiazide (HYDRODIURIL) 12.5 MG tablet Take 25 mg by mouth daily.    ROS:  Review of Systems  Constitutional:  Positive for malaise/fatigue. Negative for fever and weight loss.  Eyes:  Negative for blurred vision.  Respiratory:  Negative for cough, shortness of breath and wheezing.   Cardiovascular:  Negative for chest pain and palpitations.  Gastrointestinal:  Negative for abdominal pain, blood in stool, melena, nausea and vomiting.  Neurological:  Positive for headaches (with elevated BP). Negative for dizziness.    Objective:   Today's Vitals: BP 138/86   Pulse (!) 104   Temp 97.9 F (36.6 C) (Temporal)   Ht 5' 1"  (1.549 m)   Wt 192 lb 3.2 oz (87.2 kg)   SpO2 99%   BMI 36.32 kg/m  Vitals with BMI 01/31/2021 12/27/2020 12/17/2020  Height 5' 1"  5' 2"  -  Weight 192 lbs 3 oz 190 lbs 6 oz -  BMI 47.09 62.83 -  Systolic 662 947 654  Diastolic 86 84 650  Pulse 104 101 77     Physical Exam Vitals reviewed.  Constitutional:      General: She is not in acute distress.    Appearance: Normal appearance.  HENT:     Head: Normocephalic and atraumatic.  Neck:     Vascular: No carotid bruit.  Cardiovascular:     Rate and Rhythm: Normal rate and  regular rhythm.     Pulses: Normal pulses.     Heart sounds: Normal heart sounds.  Pulmonary:     Effort: Pulmonary effort is normal.     Breath sounds: Normal breath sounds.  Skin:    General: Skin is warm and dry.  Neurological:     General: No focal deficit present.     Mental Status: She is alert and oriented to person, place, and time.  Psychiatric:        Mood and Affect: Mood normal.        Behavior: Behavior normal.        Judgment: Judgment normal.         Assessment and Plan   1. Iron deficiency anemia, unspecified iron deficiency anemia type   2. Class 2 severe obesity with serious comorbidity and body mass index (BMI) of 36.0 to 36.9 in adult, unspecified obesity type ([place])   3. Fatigue, unspecified type   4. Hypertension, unspecified type   5. Diverticulosis      Plan: 1.  We will check blood work including CBC, ferritin, and iron level for further evaluation. 2.  We briefly discussed some lifestyle recommendations aimed at helping her with weight loss.  I recommend she try some intermittent fasting.  Recommend she try a 16-hour fast at least 1 day week but up to 7 days a week as long as she can tolerate it.  We did discuss how to do this safely and I have given her handout on this.  We also discussed dietary recommendations.  May consider further adjustments to these recommendations based on tolerance of fasting. 3.  We will collect blood work for further evaluation today. 4.  Per shared decision making we will stop hydrochlorothiazide and start her on amlodipine.  I have told her to start 5 mg by mouth daily for 1 week and then after a week as long she feels well to increase her dose to 10 mg by mouth daily.  I am going to check metabolic panel to monitor for any hypokalemia.  If she does have low potassium would recommend putting her on potassium supplement and then she will follow-up in approximately 2 weeks for annual exam, blood pressure check, and possibly  repeating metabolic panel pending current potassium level today. 5.  Appears to be stable and is not in any acute pain at this time.  We will continue to monitor and treat as needed.  Tests ordered Orders Placed This Encounter  Procedures   CBC with Differential/Platelets   CMP with eGFR([facility])   Lipid Panel   Hemoglobin A1c   TSH   Iron   Ferritin   T3, Free   T4, Free      Meds ordered this encounter  Medications   amLODipine (NORVASC) 5 MG tablet    Sig: Take 2 tablets (10 mg total) by mouth daily.    Dispense:  90 tablet    Refill:  1    Order Specific Question:   Supervising Provider    Answer:   Doree Albee [[id number]]    Patient to follow-up in 2 weeks or sooner as needed.  Ailene Ards, NP

## 2021-02-04 ENCOUNTER — Other Ambulatory Visit (INDEPENDENT_AMBULATORY_CARE_PROVIDER_SITE_OTHER): Payer: Self-pay | Admitting: Internal Medicine

## 2021-02-04 ENCOUNTER — Encounter (INDEPENDENT_AMBULATORY_CARE_PROVIDER_SITE_OTHER): Payer: Self-pay | Admitting: Nurse Practitioner

## 2021-02-04 DIAGNOSIS — Z131 Encounter for screening for diabetes mellitus: Secondary | ICD-10-CM

## 2021-02-04 DIAGNOSIS — Z13220 Encounter for screening for lipoid disorders: Secondary | ICD-10-CM

## 2021-02-04 DIAGNOSIS — D509 Iron deficiency anemia, unspecified: Secondary | ICD-10-CM

## 2021-02-04 DIAGNOSIS — I10 Essential (primary) hypertension: Secondary | ICD-10-CM

## 2021-02-04 DIAGNOSIS — R5383 Other fatigue: Secondary | ICD-10-CM

## 2021-02-04 LAB — TSH

## 2021-02-04 LAB — HEMOGLOBIN A1C

## 2021-02-04 LAB — COMPLETE METABOLIC PANEL WITH GFR

## 2021-02-04 LAB — LIPID PANEL

## 2021-02-04 LAB — FERRITIN

## 2021-02-04 LAB — CBC WITH DIFFERENTIAL/PLATELET

## 2021-02-05 LAB — FERRITIN: Ferritin: 5 ng/mL — ABNORMAL LOW (ref 16–154)

## 2021-02-05 LAB — CBC
HCT: 34.3 % — ABNORMAL LOW (ref 35.0–45.0)
Hemoglobin: 10.9 g/dL — ABNORMAL LOW (ref 11.7–15.5)
MCH: 25.8 pg — ABNORMAL LOW (ref 27.0–33.0)
MCHC: 31.8 g/dL — ABNORMAL LOW (ref 32.0–36.0)
MCV: 81.1 fL (ref 80.0–100.0)
MPV: 10.4 fL (ref 7.5–12.5)
Platelets: 258 10*3/uL (ref 140–400)
RBC: 4.23 10*6/uL (ref 3.80–5.10)
RDW: 15.5 % — ABNORMAL HIGH (ref 11.0–15.0)
WBC: 3.9 10*3/uL (ref 3.8–10.8)

## 2021-02-05 LAB — LIPID PANEL
Cholesterol: 163 mg/dL (ref <200)
HDL: 55 mg/dL (ref >=50)
LDL Cholesterol (Calc): 95 mg/dL (calc)
Non-HDL Cholesterol (Calc): 108 mg/dL (calc) (ref <130)
Total CHOL/HDL Ratio: 3 (calc) (ref <5.0)
Triglycerides: 44 mg/dL (ref <150)

## 2021-02-05 LAB — COMPLETE METABOLIC PANEL WITH GFR
AG Ratio: 1.4 (calc) (ref 1.0–2.5)
ALT: 17 U/L (ref 6–29)
AST: 16 U/L (ref 10–30)
Albumin: 4 g/dL (ref 3.6–5.1)
Alkaline phosphatase (APISO): 42 U/L (ref 31–125)
BUN: 11 mg/dL (ref 7–25)
CO2: 25 mmol/L (ref 20–32)
Calcium: 8.6 mg/dL (ref 8.6–10.2)
Chloride: 107 mmol/L (ref 98–110)
Creat: 0.71 mg/dL (ref 0.50–1.10)
GFR, Est African American: 131 mL/min/{1.73_m2} (ref >=60)
GFR, Est Non African American: 113 mL/min/{1.73_m2} (ref >=60)
Globulin: 2.8 g/dL (calc) (ref 1.9–3.7)
Glucose, Bld: 75 mg/dL (ref 65–139)
Potassium: 3.6 mmol/L (ref 3.5–5.3)
Sodium: 139 mmol/L (ref 135–146)
Total Bilirubin: 0.4 mg/dL (ref 0.2–1.2)
Total Protein: 6.8 g/dL (ref 6.1–8.1)

## 2021-02-05 LAB — HEMOGLOBIN A1C
Hgb A1c MFr Bld: 5.3 % of total Hgb (ref <5.7)
Mean Plasma Glucose: 105 mg/dL
eAG (mmol/L): 5.8 mmol/L

## 2021-02-05 LAB — T3, FREE: T3, Free: 2.9 pg/mL (ref 2.3–4.2)

## 2021-02-05 LAB — IRON, TOTAL/TOTAL IRON BINDING CAP
%SAT: 13 % (calc) — ABNORMAL LOW (ref 16–45)
Iron: 49 ug/dL (ref 40–190)
TIBC: 390 mcg/dL (calc) (ref 250–450)

## 2021-02-05 LAB — TSH: TSH: 0.56 mIU/L

## 2021-02-05 LAB — T4, FREE: Free T4: 1 ng/dL (ref 0.8–1.8)

## 2021-02-14 ENCOUNTER — Ambulatory Visit (INDEPENDENT_AMBULATORY_CARE_PROVIDER_SITE_OTHER): Payer: 59 | Admitting: Nurse Practitioner

## 2021-02-14 ENCOUNTER — Other Ambulatory Visit: Payer: Self-pay

## 2021-02-14 VITALS — BP 144/78 | HR 105 | Temp 98.1°F | Ht 62.0 in | Wt 192.2 lb

## 2021-02-14 DIAGNOSIS — Z0001 Encounter for general adult medical examination with abnormal findings: Secondary | ICD-10-CM

## 2021-02-14 DIAGNOSIS — I10 Essential (primary) hypertension: Secondary | ICD-10-CM

## 2021-02-14 DIAGNOSIS — Z6836 Body mass index (BMI) 36.0-36.9, adult: Secondary | ICD-10-CM | POA: Diagnosis not present

## 2021-02-14 MED ORDER — INSULIN PEN NEEDLE 32G X 4 MM MISC: 1.0000 | Freq: Every day | 1 refills | Status: AC

## 2021-02-14 MED ORDER — LOSARTAN POTASSIUM 25 MG PO TABS
25.0000 mg | ORAL_TABLET | Freq: Every day | ORAL | 1 refills | Status: AC
Start: 2021-02-14 — End: ?

## 2021-02-14 MED ORDER — SAXENDA 18 MG/3ML ~~LOC~~ SOPN: PEN_INJECTOR | SUBCUTANEOUS | 1 refills | Status: AC

## 2021-02-14 NOTE — Progress Notes (Signed)
Subjective:  Patient ID: Megan Jenkins, female    DOB: 12/12/1985  Age: 35 y.o. MRN: [id number]  CC:  Chief Complaint  Patient presents with   Annual Exam   Hypertension   Obesity      HPI  This patient arrives today for the above.  Annual exam: She is up-to-date with Pap smear, tetanus shot, flu shot, and COVID-19 vaccine.  She would be due for hepatitis C screening but she would prefer not to undergo this at this time.  She has had HIV screening.  Hypertension: She is a history of hypertension and we started her on amlodipine 10 mg by mouth daily at last office visit.  She is tolerating medication well but has been monitoring her blood pressure at home and tells me it still a little bit higher than she would like.  Obesity: She is actively trying to lose weight.  She has used phentermine in the past she tells me she was on it for about 1 year and was on it in 2021.  She ended up having to stop it due to negative side effects such as high blood pressure.  She did lose weight when she was on metformin, but since stopping the phentermine she has regained the weight.  She would be interested in combination of pharmacological therapy as well as lifestyle changes.  She would like to discuss intermittent fasting as well as Saxenda.  She denies any personal or family history of thyroid cancer or pancreatitis.  Past Medical History:  Diagnosis Date   Abnormal Pap smear 2012   No treatment, "Just watching it q 6 months"   Allergy    Anemia    Anxiety    Depression    Diverticulitis    Hypertension       Family History  Problem Relation Age of Onset   Hypertension Mother    Diabetes Mother    Cancer Maternal Uncle        stomach cancer   Heart disease Maternal Grandmother 37       chf    Social History   Social History Narrative   Lives with significant other, Legrand Como   Two children   Social History   Tobacco Use   Smoking status: Never   Smokeless tobacco: Never   Substance Use Topics   Alcohol use: No    Comment: occ     Current Meds  Medication Sig   Insulin Pen Needle 32G X 4 MM MISC 1 each by Does not apply route daily.   Liraglutide -Weight Management (SAXENDA) 18 MG/3ML SOPN Inject 0.54m into the skin every day for 7 days, then increase dose to 1.227minto the skin every day for 7 days, then increase dose to 1.10m91mnto skin every day for 7 days, then increase dose to 2.4mg47mto the skin every day for 7 days, then increase to 3.0mg 89mo the skin daily.   losartan (COZAAR) 25 MG tablet Take 1 tablet (25 mg total) by mouth daily.    ROS:  Review of Systems  Constitutional:  Positive for malaise/fatigue. Negative for fever and weight loss.  Eyes:  Negative for blurred vision.  Respiratory:  Negative for shortness of breath.   Cardiovascular:  Negative for chest pain and palpitations.  Gastrointestinal:  Negative for abdominal pain and blood in stool.  Neurological:  Negative for dizziness and headaches.  Psychiatric/Behavioral:  Negative for suicidal ideas.     Objective:   Today's Vitals: BP (!)Marland Kitchen  144/78   Pulse (!) 105   Temp 98.1 F (36.7 C)   Ht 5' 2"  (1.575 m)   Wt 192 lb 3.2 oz (87.2 kg)   LMP 01/30/2021   SpO2 98%   BMI 35.15 kg/m  Vitals with BMI 02/14/2021 01/31/2021 12/27/2020  Height 5' 2"  5' 1"  5' 2"   Weight 192 lbs 3 oz 192 lbs 3 oz 190 lbs 6 oz  BMI 35.14 19.50 93.26  Systolic 712 458 099  Diastolic 78 86 84  Pulse 833 104 101     Physical Exam Vitals reviewed. Exam conducted with a chaperone present.  Constitutional:      Appearance: Normal appearance.  HENT:     Head: Normocephalic and atraumatic.     Right Ear: Tympanic membrane, ear canal and external ear normal.     Left Ear: Tympanic membrane, ear canal and external ear normal.  Eyes:     General:        Right eye: No discharge.        Left eye: No discharge.     Extraocular Movements: Extraocular movements intact.     Conjunctiva/sclera: Conjunctivae  normal.     Pupils: Pupils are equal, round, and reactive to light.  Neck:     Vascular: No carotid bruit.  Cardiovascular:     Rate and Rhythm: Normal rate and regular rhythm.     Pulses: Normal pulses.     Heart sounds: Normal heart sounds. No murmur heard. Pulmonary:     Effort: Pulmonary effort is normal.     Breath sounds: Normal breath sounds.  Chest:  Breasts:    Breasts are symmetrical.     Right: Normal. No supraclavicular adenopathy.     Left: Normal. No supraclavicular adenopathy.     Comments: Nellie Hairston as chaperone Abdominal:     General: Abdomen is flat. Bowel sounds are normal. There is no distension.     Palpations: Abdomen is soft. There is no mass.     Tenderness: There is no abdominal tenderness.  Musculoskeletal:        General: No tenderness.     Cervical back: Neck supple. No muscular tenderness.     Right lower leg: No edema.     Left lower leg: No edema.  Lymphadenopathy:     Cervical: No cervical adenopathy.     Upper Body:     Right upper body: No supraclavicular adenopathy.     Left upper body: No supraclavicular adenopathy.  Skin:    General: Skin is warm and dry.  Neurological:     General: No focal deficit present.     Mental Status: She is alert and oriented to person, place, and time.     Motor: No weakness.     Gait: Gait normal.  Psychiatric:        Mood and Affect: Mood normal.        Behavior: Behavior normal.        Judgment: Judgment normal.         Assessment and Plan   1. Encounter for general adult medical examination with abnormal findings   2. Hypertension, unspecified type   3. Class 2 severe obesity with serious comorbidity and body mass index (BMI) of 36.0 to 36.9 in adult, unspecified obesity type ([place])      Plan: 1.  She is not sure of exact COVID-19 vaccination dates, I encouraged her to bring her card next time she comes the office so we can  put this into the system.  We will not do hepatitis C screening  per her preference.  She is up-to-date with other screenings recommended for a female of her age. 2.  We will add low-dose losartan to her medication regimen to see if this resulted in improved blood pressure control.  She will follow-up in approximately 10 days to have metabolic panel redrawn. 3.  We did discuss treatment options and she would like to try intermittent fasting.  She is going to consider a 16-hour fast a few days a week.  We did discuss how to do participate in intermittent fasting in a safe manner and the importance of hydration when she is outside of her eating window.  She would also like to try Saxenda and I do think she would be a good candidate for this.  We will prescribe this medication today and she will follow-up in about 6 weeks with Dr. Anastasio Champion to see how she is tolerating the fasting and the medication.  We did go over common side effects of Saxenda and what to do if these were to occur.  Tests ordered Orders Placed This Encounter  Procedures   CMP with eGFR([facility])      Meds ordered this encounter  Medications   losartan (COZAAR) 25 MG tablet    Sig: Take 1 tablet (25 mg total) by mouth daily.    Dispense:  30 tablet    Refill:  1    Order Specific Question:   Supervising Provider    Answer:   Doree Albee [[id number]]   Liraglutide -Weight Management (SAXENDA) 18 MG/3ML SOPN    Sig: Inject 0.870m into the skin every day for 7 days, then increase dose to 1.222minto the skin every day for 7 days, then increase dose to 1.70m31mnto skin every day for 7 days, then increase dose to 2.4mg67mto the skin every day for 7 days, then increase to 3.0mg 76mo the skin daily.    Dispense:  15 mL    Refill:  1    Order Specific Question:   Supervising Provider    Answer:   GOSRAHurshel [id number]]   Insulin Pen Needle 32G X 4 MM MISC    Sig: 1 each by Does not apply route daily.    Dispense:  30 each    Refill:  1    Order Specific Question:   Supervising Provider    Answer:    GOSRADoree Albee7[[id number]]atient to follow-up in 10 days for lab draw in 6 weeks for office visit.  She was encouraged to call the office sooner if needed.  In addition to performing her annual physical exam I also performed an office visit to address her concerns.  SARAHAilene Ards

## 2021-02-14 NOTE — Patient Instructions (Signed)
[facility] Dietary Recommendations for Weight Loss What to Avoid Avoid added sugars Often added sugar can be found in processed foods such as many condiments, dry cereals, cakes, cookies, chips, crisps, crackers, candies, sweetened drinks, etc.  Read labels and AVOID/DECREASE use of foods with the following in their ingredient list: Sugar, fructose, high fructose corn syrup, sucrose, glucose, maltose, dextrose, molasses, cane sugar, brown sugar, any type of syrup, agave nectar, etc.   Avoid snacking in between meals Avoid foods made with flour If you are going to eat food made with flour, choose those made with whole-grains; and, minimize your consumption as much as is tolerable Avoid processed foods These foods are generally stocked in the middle of the grocery store. Focus on shopping on the perimeter of the grocery.  Avoid Meat  We recommend following a plant-based diet at [facility]. Thus, we recommend avoiding meat as a general rule. Consider eating beans, legumes, eggs, and/or dairy products for regular protein sources If you plan on eating meat limit to 4 ounces of meat at a time and choose lean options such as Fish, chicken, turkey. Avoid red meat intake such as pork and/or steak What to Include Vegetables GREEN LEAFY VEGETABLES: Kale, spinach, mustard greens, collard greens, cabbage, broccoli, etc. OTHER: Asparagus, cauliflower, eggplant, carrots, peas, Brussel sprouts, tomatoes, bell peppers, zucchini, beets, cucumbers, etc. Grains, seeds, and legumes Beans: kidney beans, black eyed peas, garbanzo beans, black beans, pinto beans, etc. Whole, unrefined grains: brown rice, barley, bulgur, oatmeal, etc. Healthy fats  Avoid highly processed fats such as vegetable oil Examples of healthy fats: avocado, olives, virgin olive oil, dark chocolate (?72% Cocoa), nuts (peanuts, almonds, walnuts, cashews, pecans, etc.) None to Low Intake of Animal Sources of Protein Meat  sources: chicken, turkey, salmon, tuna. Limit to 4 ounces of meat at one time. Consider limiting dairy sources, but when choosing dairy focus on: PLAIN Greek yogurt, cottage cheese, high-protein milk Fruit Choose berries  When to Eat Intermittent Fasting: Choosing not to eat for a specific time period, but DO FOCUS ON HYDRATION when fasting Multiple Techniques: Time Restricted Eating: eat 3 meals in a day, each meal lasting no more than 60 minutes, no snacks between meals 16-18 hour fast: fast for 16 to 18 hours up to 7 days a week. Often suggested to start with 2-3 nonconsecutive days per week.  Remember the time you sleep is counted as fasting.  Examples of eating schedule: Fast from 7:00pm-11:00am. Eat between 11:00am-7:00pm.  24-hour fast: fast for 24 hours up to every other day. Often suggested to start with 1 day per week Remember the time you sleep is counted as fasting Examples of eating schedule:  Eating day: eat 2-3 meals on your eating day. If doing 2 meals, each meal should last no more than 90 minutes. If doing 3 meals, each meal should last no more than 60 minutes. Finish last meal by 7:00pm. Fasting day: Fast until 7:00pm.  IF YOU FEEL UNWELL FOR ANY REASON/IN ANY WAY WHEN FASTING, STOP FASTING BY EATING A NUTRITIOUS SNACK OR LIGHT MEAL ALWAYS FOCUS ON HYDRATION DURING FASTS Acceptable Hydration sources: water, broths, tea/coffee (black tea/coffee is best but using a small amount of whole-fat dairy products in coffee/tea is acceptable).  Poor Hydration Sources: anything with sugar or artificial sweeteners added to it  These recommendations have been developed for patients that are actively receiving medical care from either Dr. [facility] or Blayde Bacigalupi, DNP, NP-C at [facility]. These recommendations   are developed for patients with specific medical conditions and are not meant to be distributed or used by others that are not actively receiving care from either provider  listed above at [facility]. It is not appropriate to participate in the above eating plans without proper medical supervision.   Reference: Fung, J. The obesity code. [place]: [place]; 2016.   

## 2021-02-26 ENCOUNTER — Other Ambulatory Visit (INDEPENDENT_AMBULATORY_CARE_PROVIDER_SITE_OTHER): Payer: 59

## 2021-02-27 ENCOUNTER — Other Ambulatory Visit: Payer: Self-pay

## 2021-02-28 ENCOUNTER — Telehealth (INDEPENDENT_AMBULATORY_CARE_PROVIDER_SITE_OTHER): Payer: Self-pay

## 2021-02-28 LAB — COMPLETE METABOLIC PANEL WITH GFR
AG Ratio: 1.5 (calc) (ref 1.0–2.5)
ALT: 14 U/L (ref 6–29)
AST: 14 U/L (ref 10–30)
Albumin: 4.2 g/dL (ref 3.6–5.1)
Alkaline phosphatase (APISO): 48 U/L (ref 31–125)
BUN/Creatinine Ratio: 8 (calc) (ref 6–22)
BUN: 6 mg/dL — ABNORMAL LOW (ref 7–25)
CO2: 27 mmol/L (ref 20–32)
Calcium: 9.2 mg/dL (ref 8.6–10.2)
Chloride: 105 mmol/L (ref 98–110)
Creat: 0.71 mg/dL (ref 0.50–1.10)
GFR, Est African American: 131 mL/min/{1.73_m2} (ref >=60)
GFR, Est Non African American: 113 mL/min/{1.73_m2} (ref >=60)
Globulin: 2.8 g/dL (calc) (ref 1.9–3.7)
Glucose, Bld: 86 mg/dL (ref 65–139)
Potassium: 3.4 mmol/L — ABNORMAL LOW (ref 3.5–5.3)
Sodium: 139 mmol/L (ref 135–146)
Total Bilirubin: 0.3 mg/dL (ref 0.2–1.2)
Total Protein: 7 g/dL (ref 6.1–8.1)

## 2021-02-28 NOTE — Telephone Encounter (Signed)
Pt called into [facility] re: new Rx for Saxenda. She wanted to know why the prior authorization has not been completed. She stated "it was given to me months ago"! Pt was very rude, sharp behavior on her call. Pt stated I was the one who came in yesterday for labs. On that visit yesterday she "claimed" the office was closed and no note was on the door stating we was closed. Pt was upset on this matter as well.  Explain to pt the process of Prior Auth has /will be worked as seen by [employer], the blonde hairs person up front working. She stated she remember who I was. I explained to her that I am the person who handles "all prior authorizations for medical Rx, procedures for both providers in office. Being that [facility] just return from [place] of 4th July weekend closed from 6/30-02/27/21. We will work up once we get through all fax refills orders.   Stating that I am doing at least 5 different jobs today to keep staff on track and office running as best as possible Due to limited staffing. She stated she understands, being she works at a office as well. So I reassured her that once I got information reviewed and signed off by providers I can contact her once I get a notice back from her insurance plan. Pt was in agree to information given.

## 2021-03-04 ENCOUNTER — Telehealth (INDEPENDENT_AMBULATORY_CARE_PROVIDER_SITE_OTHER): Payer: Self-pay

## 2021-03-13 ENCOUNTER — Encounter (INDEPENDENT_AMBULATORY_CARE_PROVIDER_SITE_OTHER): Payer: Self-pay

## 2021-03-13 ENCOUNTER — Telehealth (INDEPENDENT_AMBULATORY_CARE_PROVIDER_SITE_OTHER): Payer: Self-pay

## 2021-03-13 NOTE — Telephone Encounter (Signed)
Patient called the office today inquiring about her Saxenda authorization. I let the patient know I will look into this and call her back.  I did find the paperwork for the PA and it looks like this was sent to [employer] [facility] Prior Authorization Team and they were unable to process request but there is paperwork that was filled out including office notes that were sent with completed forms. Number on form [phone].  I called [facility] and spoke to Diane and she stated that the authorization has not been approved yet and the number she has on file for insurance is [phone].  I do see on her [employer] insurance card the number to Prior Authorization is [phone] and you may be able to complete request on this line.

## 2021-03-13 NOTE — Telephone Encounter (Signed)
I attempted to call to do the prior authorization, however I did not have time in between patient care earlier today before 5 PM.  When I did call there was no one to complete the prior authorization over the phone so I will try again next week when I return to the office. 2.

## 2021-03-18 ENCOUNTER — Encounter (INDEPENDENT_AMBULATORY_CARE_PROVIDER_SITE_OTHER): Payer: Self-pay

## 2021-03-18 NOTE — Telephone Encounter (Signed)
Printed a copy of the forms to give her to complete and return for [facility] staff to sign and add NP signature.

## 2021-03-18 NOTE — Telephone Encounter (Signed)
THIS MEDICATION IS NOT COVERED BY INSURANCE. SHE HAS NOT SHOWN TRIED OR FAIL ON ANY PILL FORMS. SUCH AS METFORMIN, GLP-1 FORM.

## 2021-03-21 ENCOUNTER — Encounter (INDEPENDENT_AMBULATORY_CARE_PROVIDER_SITE_OTHER): Payer: Self-pay | Admitting: Nurse Practitioner

## 2021-04-18 ENCOUNTER — Ambulatory Visit (INDEPENDENT_AMBULATORY_CARE_PROVIDER_SITE_OTHER): Payer: 59 | Admitting: Internal Medicine

## 2021-06-15 ENCOUNTER — Other Ambulatory Visit (INDEPENDENT_AMBULATORY_CARE_PROVIDER_SITE_OTHER): Payer: Self-pay | Admitting: Nurse Practitioner

## 2021-06-15 DIAGNOSIS — R5383 Other fatigue: Secondary | ICD-10-CM

## 2021-06-15 DIAGNOSIS — D509 Iron deficiency anemia, unspecified: Secondary | ICD-10-CM

## 2022-02-19 ENCOUNTER — Encounter (INDEPENDENT_AMBULATORY_CARE_PROVIDER_SITE_OTHER): Payer: 59 | Admitting: Nurse Practitioner

## 2023-04-09 IMAGING — US US PELVIS COMPLETE WITH TRANSVAGINAL
1 series · 13 of 25 positions shown · non-contrast
Comparison: None

CLINICAL DATA: Pelvic pain

EXAM:
TRANSABDOMINAL AND TRANSVAGINAL ULTRASOUND OF PELVIS
TECHNIQUE: Both transabdominal and transvaginal ultrasound examinations of the
pelvis were performed. Transabdominal technique was performed for
global imaging of the pelvis including uterus, ovaries, adnexal
regions, and pelvic cul-de-sac. It was necessary to proceed with
endovaginal exam following the transabdominal exam to visualize the
uterus, endometrium and ovaries.

[Series 1: us pelvic complete with transvaginal · 13 of 93 slices shown]
[im 1/93]
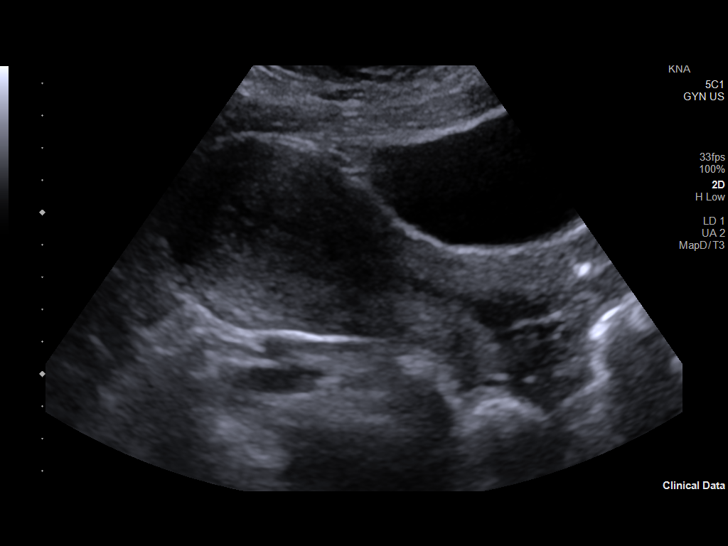
[im 8/93]
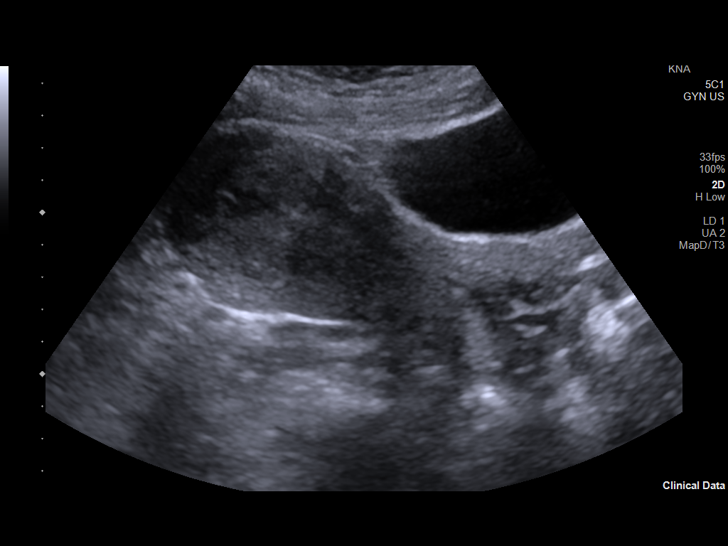
[im 16/93]
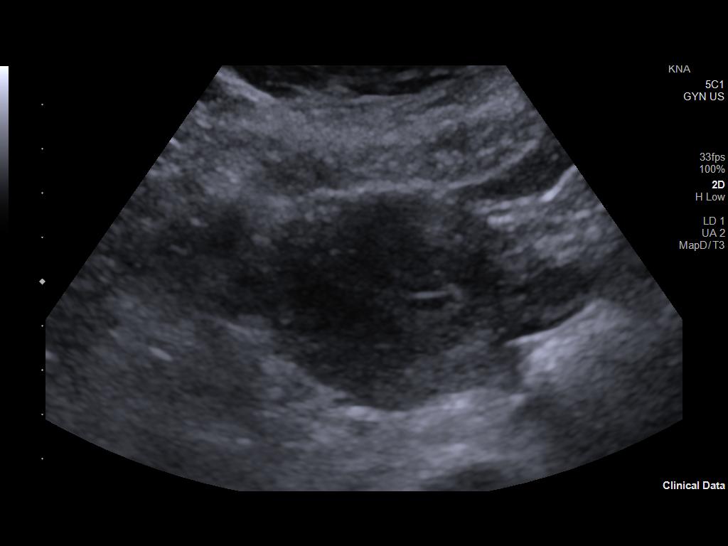
[im 24/93]
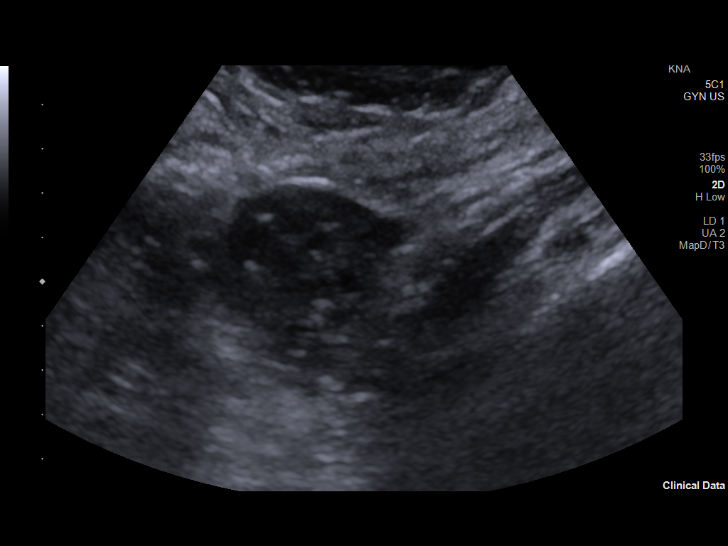
[im 31/93]
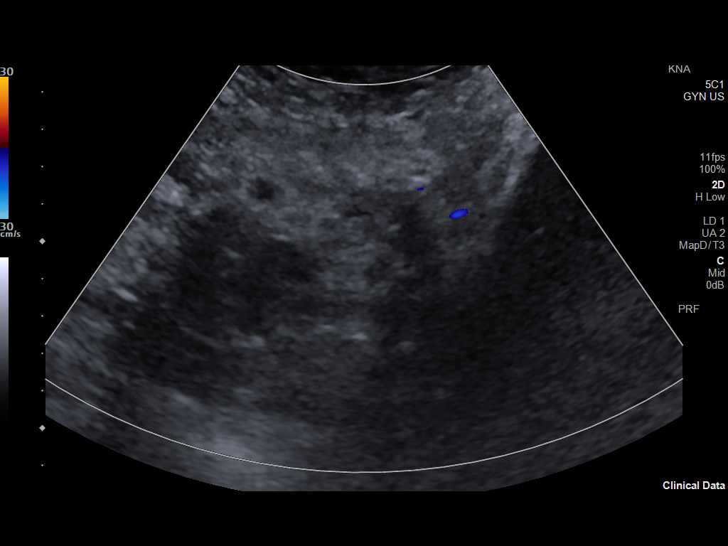
[im 39/93]
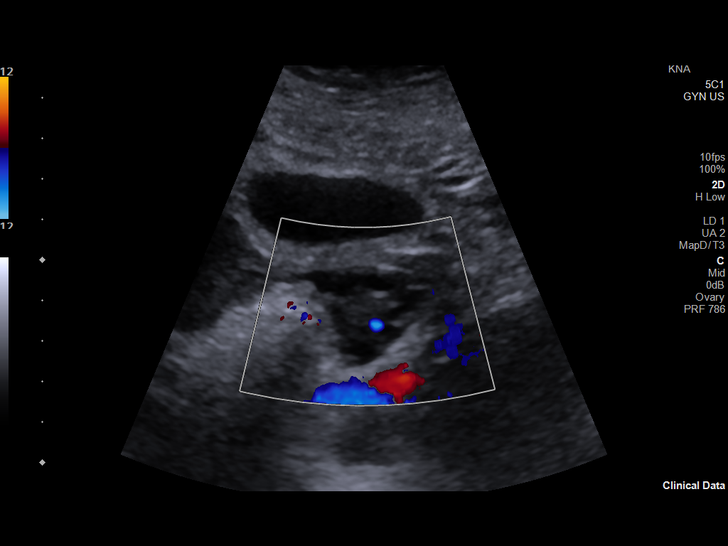
[im 47/93]
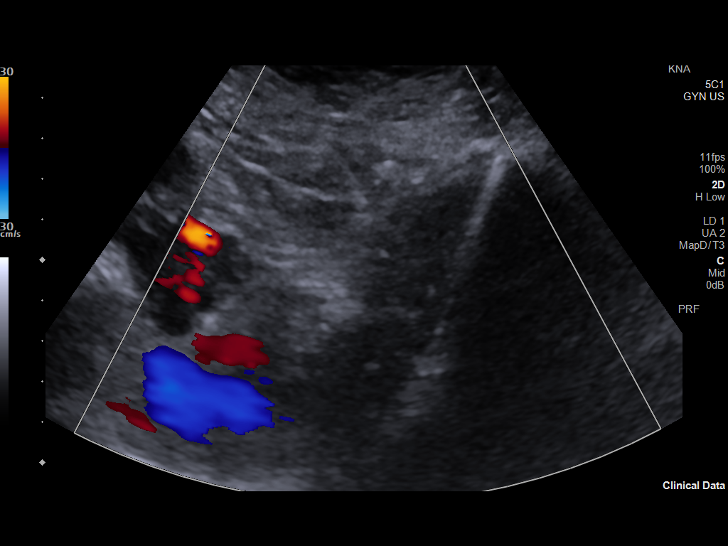
[im 54/93]
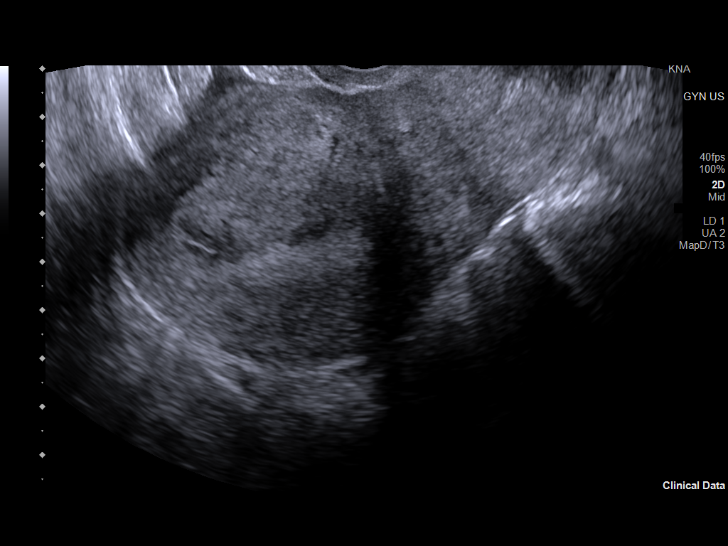
[im 62/93]
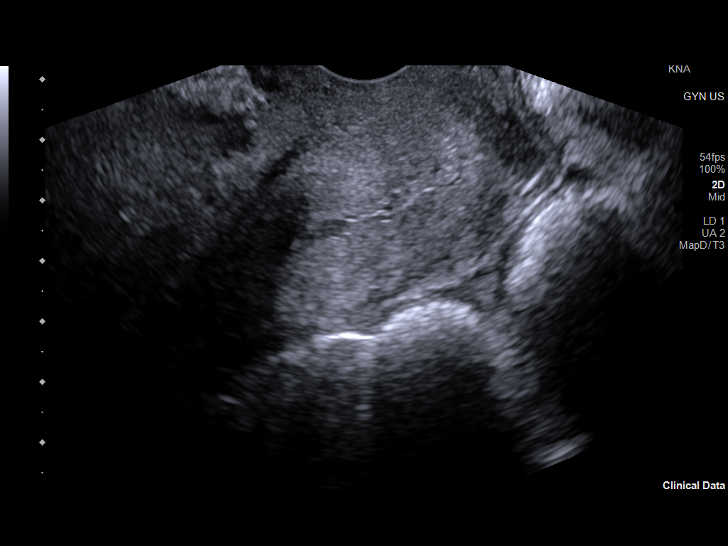
[im 70/93]
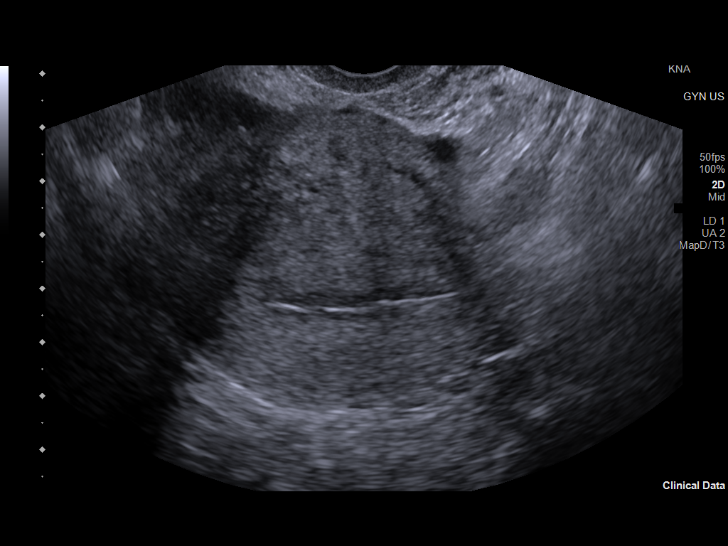
[im 77/93]
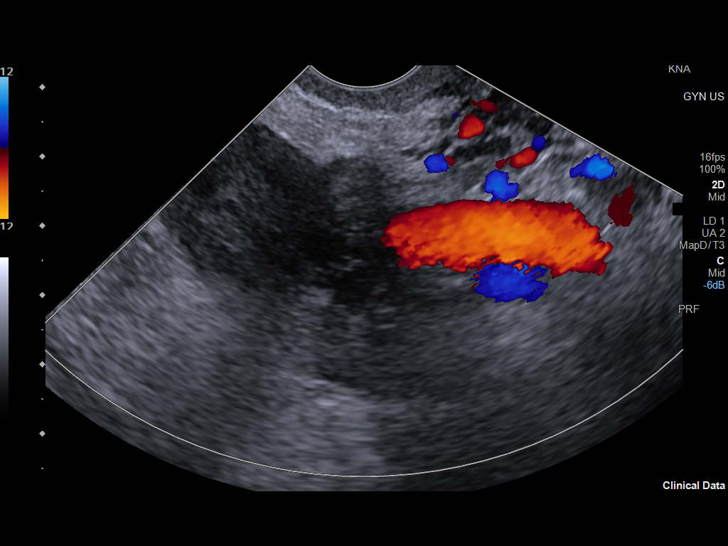
[im 85/93]
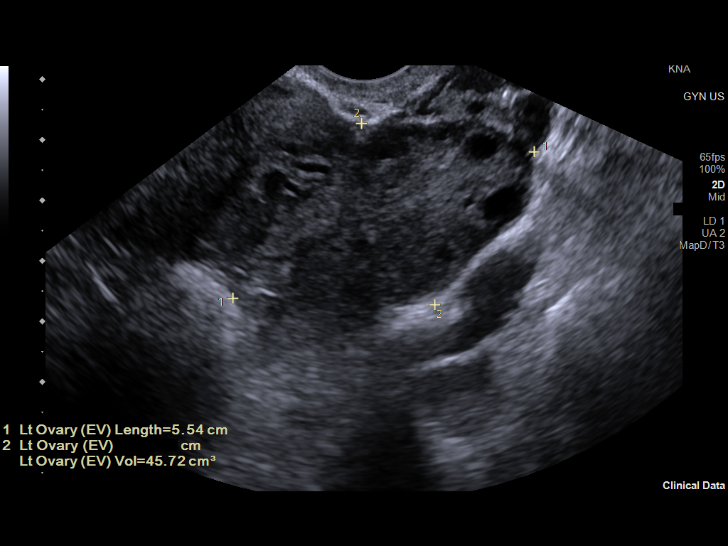
[im 93/93]
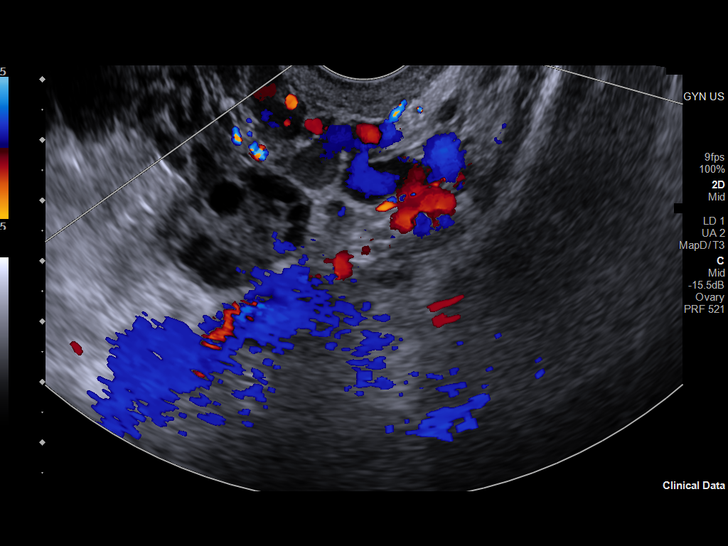

[13 of 25 positions shown; findings below may reference images not displayed]

FINDINGS: Uterus

Measurements: 14.2 x 6.3 by 6.4 cm = volume: 238.6 mL. No fibroids
or other mass visualized.

Endometrium

Thickness: 13.4.  No focal abnormality visualized.

Right ovary

Measurements: 3.4 x 2.5 x 3.9 cm = volume: 17.4 mL. Normal
appearance/no adnexal mass.

Left ovary

Measurements: 5.5 x 3.2 x 3.4 cm = volume: 34.5 mL. Multiple
prominent peripheral follicles are identified resulting in string of
pearls appearance of the left ovary.

Other findings

No abnormal free fluid.
IMPRESSION: 1. No acute findings.  No explanation for patient's pelvic pain
2. Bilateral ovary enlargement. No focal lesion identified. Multiple
prominent follicles are noted around the periphery of the left
ovary. Correlate for any clinical signs or symptoms of polycystic
ovary syndrome.
